# Patient Record
Sex: Female | Born: 1975 | Hispanic: Yes | Marital: Married | Smoking: Never smoker
Health system: Southern US, Community
[De-identification: ages and names within clinical notes are randomized; demographics above are authoritative.]

## PROBLEM LIST (undated history)

## (undated) DIAGNOSIS — Z789 Other specified health status: Secondary | ICD-10-CM

## (undated) DIAGNOSIS — E119 Type 2 diabetes mellitus without complications: Secondary | ICD-10-CM

## (undated) DIAGNOSIS — N611 Abscess of the breast and nipple: Secondary | ICD-10-CM

## (undated) HISTORY — PX: NO PAST SURGERIES: SHX2092

## (undated) HISTORY — DX: Abscess of the breast and nipple: N61.1

## (undated) HISTORY — DX: Type 2 diabetes mellitus without complications: E11.9

## (undated) HISTORY — DX: Other specified health status: Z78.9

---

## 2014-06-05 ENCOUNTER — Observation Stay: Payer: Self-pay | Admitting: Obstetrics and Gynecology

## 2014-06-09 ENCOUNTER — Encounter: Payer: Self-pay | Admitting: Obstetrics and Gynecology

## 2014-07-08 ENCOUNTER — Ambulatory Visit: Payer: Self-pay | Admitting: Advanced Practice Midwife

## 2014-07-15 ENCOUNTER — Ambulatory Visit: Payer: Self-pay | Admitting: Advanced Practice Midwife

## 2014-08-19 DIAGNOSIS — O24419 Gestational diabetes mellitus in pregnancy, unspecified control: Secondary | ICD-10-CM | POA: Insufficient documentation

## 2014-08-19 DIAGNOSIS — O09523 Supervision of elderly multigravida, third trimester: Secondary | ICD-10-CM | POA: Insufficient documentation

## 2014-08-19 DIAGNOSIS — O99019 Anemia complicating pregnancy, unspecified trimester: Secondary | ICD-10-CM | POA: Insufficient documentation

## 2014-08-19 DIAGNOSIS — O0990 Supervision of high risk pregnancy, unspecified, unspecified trimester: Secondary | ICD-10-CM | POA: Insufficient documentation

## 2014-12-23 NOTE — H&P (Signed)
L&D Evaluation:  History:  HPI 39 yo [id number] with LMP of  ? EDD of 09/28/14 presents tonight after bending over picking up her 39 yr old and pulled her lower back. Crying with the pain. Unable to sit up without intense pain in lower lumbar area. No spasm noted. No UC's. 4th grade education and illiteracy for pt hx., uti hx and gest DM. Pt also c/o nosebleed tonight.   Presents with back pain   Patient's Surgical History none   Medications Pre Natal Vitamins   Allergies NKDA   Social History Homemaker   Family History Non-Contributory   ROS:  ROS All systems were reviewed.  HEENT, CNS, GI, GU, Respiratory, CV, Renal and Musculoskeletal systems were found to be normal.   Exam:  Vital Signs stable   General no apparent distress   Mental Status clear   Chest clear   Heart normal sinus rhythm, no murmur/gallop/rubs   Abdomen gravid, non-tender   Estimated Fetal Weight Average for gestational age   Back No spasm of lower lumbar   Edema no edema   Mebranes Intact   FHT normal rate with no decels, FHR WNL for age of fetus   Skin dry   Lymph no lymphadenopathy   Impression:  Impression IUP at 23 4/7 weeks with lower back pain   Plan:  Plan Dc home with Norco x 1 and Flexeril and RX for Flexeril   Comments Advised to take hot showers and use heat to lower back via heating pad. If worsening, go to ER FU as assigned   Electronic Signatures: Sharee PimpleJones, Jaimy Kliethermes W (CNM)  (Signed 22-Oct-15 22:16)  Authored: L&D Evaluation   Last Updated: 22-Oct-15 22:16 by Sharee PimpleJones, Delphin Funes W (CNM)

## 2017-09-20 ENCOUNTER — Encounter (INDEPENDENT_AMBULATORY_CARE_PROVIDER_SITE_OTHER): Payer: Self-pay

## 2017-09-20 ENCOUNTER — Ambulatory Visit
Admission: RE | Admit: 2017-09-20 | Discharge: 2017-09-20 | Disposition: A | Discharge status: Home / Self Care | Payer: Self-pay | Source: Ambulatory Visit | Attending: Oncology | Admitting: Oncology

## 2017-09-20 ENCOUNTER — Ambulatory Visit: Payer: Self-pay | Attending: Oncology | Admitting: *Deleted

## 2017-09-20 VITALS — BP 107/75 | HR 81 | Temp 98.3°F | Ht 64.0 in | Wt 152.0 lb

## 2017-09-20 DIAGNOSIS — Z0000 Encounter for general adult medical examination without abnormal findings: Secondary | ICD-10-CM

## 2017-09-20 NOTE — Patient Instructions (Signed)
Gave patient hand-out, Women Staying Healthy, Active and Well from [facility], with education on breast health, pap smears, heart and colon health. 

## 2017-09-20 NOTE — Progress Notes (Signed)
Subjective:     Patient ID: Carolyn Rios, female   DOB: 03/01/1976, 42 y.o.   MRN: [id number]  HPI   Review of Systems     Objective:   Physical Exam  Pulmonary/Chest: Right breast exhibits no inverted nipple, no mass, no nipple discharge, no skin change and no tenderness. Left breast exhibits no inverted nipple, no mass, no nipple discharge, no skin change and no tenderness. Breasts are symmetrical.         Assessment:     42 year old Hispanic female referred to [facility] by Carolyn HohKarla Leath, FNP at the [facility] for further evaluation of right breast tenderness.  Carolyn Rios, the interpreter present during the interview and exam.  Patient states she had a tick bite on the left breast about a year ago.  States the tick was embedded and does not think that the tick was ever removed.  States she had an episode of left breast pain in December lasting about 2 weeks.  She thought it had something to do with the tick.  She has not experienced any pain since then.  On clinical breast exam there is no dominant mass, nipple discharge, skin changes or lymphadenopathy.  Bilateral nipples are inverted.  The patient states this is normal for her.  There is no evidence of a tick.  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  She is agreeable.  Taught self breast awareness and need for annual screening.  Last pap on 12/20.18 was negative negative.  Next pap due in 5 years.  Patient has been screened for eligibility.  She does not have any insurance, Medicare or Medicaid.  She also meets financial eligibility.  Hand-out given on the Affordable Care Act.    Plan:     Baseline screening mammogram ordered.  Will follow-up per [facility] protocol.

## 2017-09-21 ENCOUNTER — Other Ambulatory Visit: Payer: Self-pay

## 2017-09-21 DIAGNOSIS — N63 Unspecified lump in unspecified breast: Secondary | ICD-10-CM

## 2017-11-13 ENCOUNTER — Telehealth: Payer: Self-pay | Admitting: *Deleted

## 2017-11-13 NOTE — Telephone Encounter (Signed)
Called patient via [employer] # [phone] the interpreter [facility].  A gentleman answered the cell number and gave us another number to call at [phone].  I left the patient a message to return my call.  She has not responded to a certified letter sent by the breast center wanting her to return for additional imaging.

## 2017-11-14 ENCOUNTER — Telehealth: Payer: Self-pay | Admitting: *Deleted

## 2017-11-14 ENCOUNTER — Encounter: Payer: Self-pay | Admitting: *Deleted

## 2017-11-14 NOTE — Telephone Encounter (Signed)
Tried to call patient again today via the interpreter Marisue IvanLiz # [phone] from [employer].  There was no answer on the home number.  I left a message for her to return my call.  I also tried her emergency number, and there was no answer and no voicemail.

## 2018-01-09 ENCOUNTER — Encounter: Payer: Self-pay | Admitting: *Deleted

## 2018-01-09 NOTE — Progress Notes (Signed)
I have tried on several occasions to get in touch with the patient by phone to help assist with scheduling her additional views for her birads 0 mammogram.  I spoke to [facility] today in the breast center, and a certified letter that had been mailed to the patient in February was returned as unclaimed, patient does not live here.  See media for copy.  Will close case as lost to follow-up.

## 2018-02-23 ENCOUNTER — Ambulatory Visit
Admission: RE | Admit: 2018-02-23 | Discharge: 2018-02-23 | Disposition: A | Discharge status: Home / Self Care | Payer: Self-pay | Source: Ambulatory Visit | Attending: Oncology | Admitting: Oncology

## 2018-02-23 DIAGNOSIS — N63 Unspecified lump in unspecified breast: Secondary | ICD-10-CM

## 2018-03-15 HISTORY — PX: BREAST CYST ASPIRATION: SHX578

## 2018-03-20 ENCOUNTER — Other Ambulatory Visit: Payer: Self-pay | Admitting: *Deleted

## 2018-03-20 DIAGNOSIS — N6489 Other specified disorders of breast: Secondary | ICD-10-CM

## 2018-03-21 ENCOUNTER — Emergency Department
Admission: EM | Admit: 2018-03-21 | Discharge: 2018-03-21 | Disposition: A | Discharge status: Home / Self Care | Payer: Self-pay | Attending: Emergency Medicine | Admitting: Emergency Medicine

## 2018-03-21 ENCOUNTER — Other Ambulatory Visit: Payer: Self-pay

## 2018-03-21 DIAGNOSIS — N61 Mastitis without abscess: Secondary | ICD-10-CM | POA: Insufficient documentation

## 2018-03-21 LAB — POCT PREGNANCY, URINE: Preg Test, Ur: NEGATIVE

## 2018-03-21 MED ORDER — SULFAMETHOXAZOLE-TRIMETHOPRIM 800-160 MG PO TABS
1.0000 | ORAL_TABLET | Freq: Once | ORAL | Status: AC
  Administered 2018-03-21: 1 via ORAL
  Filled 2018-03-21: qty 1

## 2018-03-21 MED ORDER — SULFAMETHOXAZOLE-TRIMETHOPRIM 800-160 MG PO TABS: 1.0000 | ORAL_TABLET | Freq: Two times a day (BID) | ORAL | 0 refills | Status: DC

## 2018-03-21 MED ORDER — CEPHALEXIN 500 MG PO CAPS
500.0000 mg | ORAL_CAPSULE | Freq: Once | ORAL | Status: AC
  Administered 2018-03-21: 500 mg via ORAL
  Filled 2018-03-21: qty 1

## 2018-03-21 MED ORDER — CEPHALEXIN 500 MG PO CAPS: 500.0000 mg | ORAL_CAPSULE | Freq: Three times a day (TID) | ORAL | 0 refills | Status: DC

## 2018-03-21 NOTE — ED Triage Notes (Addendum)
Pt in with co left breast redness and pain for 6 days. States area is tender and red, no known cause. Slight redness noted to left breast right of the nipple. No obvious swelling noted or firmness.

## 2018-03-21 NOTE — ED Provider Notes (Addendum)
[facility] Emergency Department Provider Note  ____________________________________________  Time seen: Approximately 9:46 PM  I have reviewed the triage vital signs and the nursing notes.   HISTORY  Chief Complaint Breast Pain  Spanish interpreter at bedside throughout encounter  HPI Carolyn Rios is a 42 y.o. female with no significant past medical history who complains of left breast swelling and pain for the last 6 days, gradual onset.  Today it also started appearing red.  No fevers or chills.  No shortness of breath or chest pain or other symptoms.  Never had anything like this before.  Was last pregnant and gave birth in January 2016.  Last menstrual period was 2 months ago.   Symptoms are constant, gradually worsening, nonradiating pain.  Hurts to touch, no alleviating factors  Recently had a mammogram one month ago which was unconcerning.   Past medical history noncontributory   There are no active problems to display for this patient.    Past surgical history noncontributory   Prior to Admission medications   Medication Sig Start Date End Date Taking? Authorizing Provider  cephALEXin (KEFLEX) 500 MG capsule Take 1 capsule (500 mg total) by mouth 3 (three) times daily. 03/21/18   Sharman CheekStafford, Lera Gaines, MD  sulfamethoxazole-trimethoprim (BACTRIM DS) 800-160 MG tablet Take 1 tablet by mouth 2 (two) times daily. 03/21/18   Sharman CheekStafford, Skarleth Delmonico, MD     Allergies Patient has no known allergies.   No family history on file.  Social History Social History   Tobacco Use  . Smoking status: Not on file  Substance Use Topics  . Alcohol use: Not on file  . Drug use: Not on file    Review of Systems  Constitutional:   No fever or chills.   Cardiovascular:   No chest pain or syncope. Respiratory:   No dyspnea or cough. Gastrointestinal:   Negative for abdominal pain, vomiting and diarrhea.  Musculoskeletal:   Negative for focal pain or  swelling All other systems reviewed and are negative except as documented above in ROS and HPI.  ____________________________________________   PHYSICAL EXAM:  VITAL SIGNS: ED Triage Vitals  Enc Vitals Group     BP 03/21/18 2035 124/80     Pulse Rate 03/21/18 2035 85     Resp 03/21/18 2035 20     Temp 03/21/18 2035 98.5 F (36.9 C)     Temp Source 03/21/18 2035 Oral     SpO2 03/21/18 2035 99 %     Weight 03/21/18 2036 152 lb (68.9 kg)     Height --      Head Circumference --      Peak Flow --      Pain Score 03/21/18 2036 8     Pain Loc --      Pain Edu? --      Excl. in GC? --     Vital signs reviewed, nursing assessments reviewed.   Constitutional:   Alert and oriented. Non-toxic appearance. Eyes:   Conjunctivae are normal. EOMI.  ENT      Head:   Normocephalic and atraumatic.         Mouth/Throat:   MMM, no pharyngeal erythema. No peritonsillar mass.       Neck:   No meningismus. Full ROM. Hematological/Lymphatic/Immunilogical:   No cervical lymphadenopathy.  Musculoskeletal:   Normal range of motion in all extremities.  Breast exam performed with Spanish interpreter Hiram at bedside.  Left breast shows hazy erythema swelling and tenderness in  the 9 to 10 o'clock position as seen facing the patient.  Nipple is not inverted.  No purulent discharge.  No focal swelling or fluctuance. Neurologic:   Normal speech and language.  Motor grossly intact. No acute focal neurologic deficits are appreciated.  Skin:    Skin is warm, dry with breast erythema as above.  ____________________________________________    LABS (pertinent positives/negatives) (all labs ordered are listed, but only abnormal results are displayed) Labs Reviewed - No data to display ____________________________________________   EKG    ____________________________________________    RADIOLOGY  No results  found.  ____________________________________________   PROCEDURES Procedures  ____________________________________________    CLINICAL IMPRESSION / ASSESSMENT AND PLAN / ED COURSE  Pertinent labs & imaging results that were available during my care of the patient were reviewed by me and considered in my medical decision making (see chart for details).    Patient presents with nonlactational mastitis without evidence of abscess.  Vital signs are normal.  Not suspicious for cancer.  I will start her on Bactrim and Keflex.  Advised to follow-up with surgery clinic in 2 days if not improving or if she has worsening symptoms including pronounced swelling, bright redness of the skin, or fever.      ____________________________________________   FINAL CLINICAL IMPRESSION(S) / ED DIAGNOSES    Final diagnoses:  Mastitis, left, acute     ED Discharge Orders        Ordered    sulfamethoxazole-trimethoprim (BACTRIM DS) 800-160 MG tablet  2 times daily     03/21/18 2145    cephALEXin (KEFLEX) 500 MG capsule  3 times daily     03/21/18 2145      Portions of this note were generated with dragon dictation software. Dictation errors may occur despite best attempts at proofreading.    Sharman Cheek, MD 03/21/18 2151    Sharman Cheek, MD 03/21/18 2151

## 2018-04-02 ENCOUNTER — Ambulatory Visit: Payer: Self-pay | Attending: Oncology

## 2018-04-02 DIAGNOSIS — N63 Unspecified lump in unspecified breast: Secondary | ICD-10-CM

## 2018-04-02 NOTE — Progress Notes (Signed)
  Subjective:     Patient ID: Carolyn Rios, female   DOB: 04/09/1976, 42 y.o.   MRN: [id number]  HPI   Review of Systems     Objective:   Physical Exam  Pulmonary/Chest:  4x4 cm firm, mobile, purple dicoloration, left breast mass 9  o'clock with nipple inversion.           Assessment:     42 year old hispanic patient returns for [facility] evaluation regarding left breast mass.  [facility] .  She had a Birads 0 mammogram.  Had difficulty contacting patient to return for additional views.  Certified letter returned, and case closed.  Patient returned for additional views 02/23/18, with Birads 3 results, probable benign asymmetry uppe outer left breast.  Patient seen in ED on 03/21/18 with erythema left breast 9-10 o'clock .  Treated with Bactrim, and Keflex,  Appointment scheduled with Dr. Everlene FarrierPabon.  On clinical breast exam today, a 4x4cm purplish, firm moblile mass palpated at 9 o'clock left breast.  Noted left nipple inversion which patient reports as new.  No peau d'orange skin changes.      Plan:     Phoned Irving Burtonmily at [facility] and changed appointment to 04/04/18 at 10:00.  Patient given appointment information.  She is to arrive at 9:45 with photo ID and current medications.  [facility] effective date is 09/20/17.

## 2018-04-04 ENCOUNTER — Other Ambulatory Visit: Payer: Self-pay | Admitting: Surgery

## 2018-04-04 ENCOUNTER — Ambulatory Visit (INDEPENDENT_AMBULATORY_CARE_PROVIDER_SITE_OTHER): Payer: Self-pay | Admitting: Surgery

## 2018-04-04 ENCOUNTER — Other Ambulatory Visit: Payer: Self-pay | Admitting: *Deleted

## 2018-04-04 ENCOUNTER — Telehealth: Payer: Self-pay

## 2018-04-04 ENCOUNTER — Encounter: Payer: Self-pay | Admitting: Surgery

## 2018-04-04 VITALS — BP 108/71 | HR 80 | Temp 97.7°F | Wt 153.0 lb

## 2018-04-04 DIAGNOSIS — N6320 Unspecified lump in the left breast, unspecified quadrant: Secondary | ICD-10-CM

## 2018-04-04 DIAGNOSIS — Z789 Other specified health status: Secondary | ICD-10-CM

## 2018-04-04 DIAGNOSIS — N6012 Diffuse cystic mastopathy of left breast: Secondary | ICD-10-CM

## 2018-04-04 HISTORY — DX: Other specified health status: Z78.9

## 2018-04-04 MED ORDER — SULFAMETHOXAZOLE-TRIMETHOPRIM 800-160 MG PO TABS: 1.0000 | ORAL_TABLET | Freq: Two times a day (BID) | ORAL | 0 refills | Status: DC

## 2018-04-04 NOTE — Telephone Encounter (Signed)
Breast [facility] with [facility] 04/09/2018 at 7:45 AM.  Follow Up appointment with Dr. Everlene FarrierPabon: 04/25/2018 at 10:00 AM.

## 2018-04-04 NOTE — Patient Instructions (Signed)
Yo la llamare con su cita de ultrasonido y para ver al Dr. Everlene Farrierpabon en tres semanas.   Su receta fue enviado a su farmacia.  Por favor continue tomandose su antibiotico y terminelos todo.  Llamenos si tiene alguna pregunta.

## 2018-04-04 NOTE — Telephone Encounter (Signed)
Called patient back to let her know that her appointments have been scheduled. I let her know when and where she needed to go. She understood and had no further questions.

## 2018-04-04 NOTE — Progress Notes (Signed)
Surgical Consultation  04/04/2018  Carolyn Rios is an 42 y.o. female.   CC:-year-old female recently seen in the emergency room for left mastitis.  She was placed on antibiotics.  She now comes for follow-up.  She reports that she still has intermittent breast pain on the left side that is moderate intensity and sharp in nature.  Worsens when she presses and apply pressure to her breast.  No fevers or chills.  She has been on Keflex and Bactrim.  She did have ultrasound and mammogram that I have personally reviewed a month ago this was prior to the mastitis episode and he only had minor asymmetry. No definitive masses. He reports a similar episode about a year ago that resolved with antibiotics.  There is no family history of breast cancer.    Past Medical History:  Diagnosis Date  . No known health problems 04/04/2018    Past Surgical History:  Procedure Laterality Date  . NO PAST SURGERIES      Family History  Problem Relation Age of Onset  . Blindness Mother   . Diabetes Mother   . Diabetes Father   . Hypertension Sister   . Cancer Neg Hx     Social History:  reports that she has never smoked. She has never used smokeless tobacco. She reports that she does not drink alcohol or use drugs.  Allergies: No Known Allergies  Medications reviewed.   Review of Systems:   ROS Full ROS was negative other than what is stated on the HPI  Physical Exam:  BP 108/71   Pulse 80   Temp 97.7 F (36.5 C) (Oral)   Wt 153 lb (69.4 kg)   LMP  (LMP Unknown)   BMI 26.26 kg/m   Physical Exam  Constitutional: No distress.  Neck: Normal range of motion. No JVD present. No tracheal deviation present. No thyromegaly present.  Pulmonary/Chest: Effort normal. No stridor. No respiratory distress. She has no wheezes. She has no rales.  BREAST: there is an area of induration  located at 10 o'clock on her left breast. No erythema, tenderness to palpation. Normal nipple, no  discharge or cellulitis,. Axilla w/o LAD   Abdominal: Soft. She exhibits no distension and no mass. There is no tenderness. There is no rebound and no guarding.  Skin: Skin is warm. Capillary refill takes less than 2 seconds. She is not diaphoretic.  Psychiatric: She has a normal mood and affect. Her behavior is normal. Judgment and thought content normal.      Assessment/Plan: 42 year old with recurrent mastitis and questionable breast abscess.  We will obtain an ultrasound on if there is a fluid collection will recommend aspiration of the fluid collection at the same time.  We will give an additional week of Bactrim.  I will see her back in a couple weeks with hopeful resolution of her symptoms.  There is no need for surgical intervention at this time.    Spyros Winch F Gopal Malter

## 2018-04-09 ENCOUNTER — Ambulatory Visit
Admission: RE | Admit: 2018-04-09 | Discharge: 2018-04-09 | Disposition: A | Discharge status: Home / Self Care | Payer: Self-pay | Source: Ambulatory Visit | Attending: Surgery | Admitting: Surgery

## 2018-04-09 DIAGNOSIS — N6320 Unspecified lump in the left breast, unspecified quadrant: Secondary | ICD-10-CM | POA: Insufficient documentation

## 2018-04-11 ENCOUNTER — Inpatient Hospital Stay: Payer: Self-pay | Admitting: Surgery

## 2018-04-12 LAB — BODY FLUID CULTURE
Culture: NO GROWTH
SPECIAL REQUESTS: NORMAL

## 2018-04-25 ENCOUNTER — Ambulatory Visit: Payer: Self-pay

## 2018-04-25 ENCOUNTER — Encounter: Payer: Self-pay | Admitting: Surgery

## 2018-04-25 ENCOUNTER — Ambulatory Visit (INDEPENDENT_AMBULATORY_CARE_PROVIDER_SITE_OTHER): Payer: Self-pay | Admitting: Surgery

## 2018-04-25 VITALS — BP 105/71 | HR 79 | Temp 97.5°F | Wt 156.0 lb

## 2018-04-25 DIAGNOSIS — N6012 Diffuse cystic mastopathy of left breast: Secondary | ICD-10-CM

## 2018-04-25 MED ORDER — SULFAMETHOXAZOLE-TRIMETHOPRIM 800-160 MG PO TABS: 1.0000 | ORAL_TABLET | Freq: Two times a day (BID) | ORAL | 0 refills | Status: DC

## 2018-04-25 NOTE — Patient Instructions (Signed)
Por favor tome Ibuprofen 800 MG cada 8 horas para el dolor.  Aplique hielo en el area afectada cada 20 minutos cada hora.  La volvemos a ver en 2 semanas para saber como sigue.

## 2018-04-25 NOTE — Progress Notes (Signed)
Outpatient Surgical Follow Up  04/25/2018  Carolyn Rios is an 42 y.o. female.   Chief Complaint  Patient presents with  . Follow-up    Mastitis, Left     HPI: Carolyn Rios is following up after needle aspiration of left breast abscess.  Cultures have been reviewed and there is no growth.  I have personally reviewed the images from radiology revealing the breast collection that had a significant decrease after the aspiration.  She still complaining of intermittent left breast pain.  No fevers no chills.  Pain is mild to moderate intensity and sharp in nature.  Past Medical History:  Diagnosis Date  . No known health problems 04/04/2018    Past Surgical History:  Procedure Laterality Date  . NO PAST SURGERIES      Family History  Problem Relation Age of Onset  . Blindness Mother   . Diabetes Mother   . Diabetes Father   . Hypertension Sister   . Cancer Neg Hx     Social History:  reports that she has never smoked. She has never used smokeless tobacco. She reports that she does not drink alcohol or use drugs.  Allergies: No Known Allergies  Medications reviewed.    ROS Full ROS performed and is otherwise negative other than what is stated in HPI   BP 105/71   Pulse 79   Temp (!) 97.5 F (36.4 C) (Oral)   Wt 156 lb (70.8 kg)   BMI 26.78 kg/m   Physical Exam  Constitutional: She is oriented to person, place, and time. She appears well-developed and well-nourished.  Pulmonary/Chest: Effort normal. No respiratory distress.  Abdominal: Soft. She exhibits no distension. There is no tenderness. There is no guarding.  Neurological: She is alert and oriented to person, place, and time. She displays normal reflexes. No cranial nerve deficit or sensory deficit. She exhibits normal muscle tone. Coordination normal.  Skin: Skin is warm and dry.  Psychiatric: She has a normal mood and affect. Her behavior is normal. Judgment and thought content normal.  Nursing  note and vitals reviewed.  Procedure Note: Using bedside ultrasound I was able to identify a 2.7 x 1.2 cm complex fluid collection located 9:00 o'clock 1 cm from the nipple.  I discussed with the patient in detail about the procedure and after we prepped and draped the left breast in the usual sterile fashion lidocaine 1% was injected in the skin subtenons tissue.  I aspirated about half a cc of complex fluid.  Given the thickness of the fluid and the complexity of the collection I was unable to aspirate anymore.  Control his procedure well.  Assessment/Plan:  1. Mastitis chronic, left, small collection.  Do think that currently she is developing a phlegmon without any drainable fluid collection.  We will repeat another course of antibiotics and I will follow her up in 2 weeks.  No need for emergent surgical intervention at this time.     Sterling Big, MD [facility] General Surgeon

## 2018-05-09 ENCOUNTER — Ambulatory Visit (INDEPENDENT_AMBULATORY_CARE_PROVIDER_SITE_OTHER): Payer: Self-pay | Admitting: Surgery

## 2018-05-09 ENCOUNTER — Encounter: Payer: Self-pay | Admitting: Surgery

## 2018-05-09 VITALS — BP 110/70 | HR 80 | Temp 97.5°F | Wt 156.0 lb

## 2018-05-09 DIAGNOSIS — N6012 Diffuse cystic mastopathy of left breast: Secondary | ICD-10-CM

## 2018-05-09 NOTE — Progress Notes (Signed)
Outpatient Surgical Follow Up  05/09/2018  Carolyn Rios is an 42 y.o. female.   Chief Complaint  Patient presents with  . Follow-up    Mastitis, Left    HPI: Carolyn Rios is following up for chronic mastitis she had aspiration antibiotic treatment.  She feels better.  There is some mild intermittent pains in the left breast.  No fevers no chills.  No other constitutional symptoms.  No breast discharge.  Past Medical History:  Diagnosis Date  . No known health problems 04/04/2018    Past Surgical History:  Procedure Laterality Date  . NO PAST SURGERIES      Family History  Problem Relation Age of Onset  . Blindness Mother   . Diabetes Mother   . Diabetes Father   . Hypertension Sister   . Cancer Neg Hx     Social History:  reports that she has never smoked. She has never used smokeless tobacco. She reports that she does not drink alcohol or use drugs.  Allergies: No Known Allergies  Medications reviewed.    ROS Full ROS performed and is otherwise negative other than what is stated in HPI   BP 110/70   Pulse 80   Temp (!) 97.5 F (36.4 C) (Skin)   Wt 156 lb (70.8 kg)   BMI 26.78 kg/m   Physical Exam  Constitutional: She is oriented to person, place, and time. She appears well-developed and well-nourished. No distress.  Pulmonary/Chest: Effort normal. No respiratory distress.  Abdominal: Soft. She exhibits no distension and no mass. There is no tenderness. There is no guarding.  Neurological: She is alert and oriented to person, place, and time. She displays normal reflexes. No cranial nerve deficit or sensory deficit. She exhibits normal muscle tone. Coordination normal.  Skin: Skin is warm and dry. Capillary refill takes less than 2 seconds.  Psychiatric: She has a normal mood and affect. Her behavior is normal. Judgment and thought content normal.  Nursing note and vitals reviewed. BREAST: Left Breast with some mild tenderness, at 9 oclock there is  some mild induration there is significant improvement as compared to 2 weeks ago.  No evidence of necrotizing infection no evidence of cellulitis     No results found for this or any previous visit (from the past 48 hour(s)). No results found.  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  Slowly improving.  No fevers no chills no evidence of drainable collections at this time.  I would like to see her in 2 to 3 weeks and reassess for the need for open drainage versus observation.      Sterling Bigiego Lucyle Alumbaugh, MD [facility] General Surgeon

## 2018-05-09 NOTE — Patient Instructions (Signed)
Por favor llamenos si tiene alguna pregunta.  En dos semanas la vuelvo a ver. Si en dos semanas no se encuentra bien le hacemos la [place].

## 2018-05-23 ENCOUNTER — Encounter: Payer: Self-pay | Admitting: Surgery

## 2018-05-23 ENCOUNTER — Ambulatory Visit (INDEPENDENT_AMBULATORY_CARE_PROVIDER_SITE_OTHER): Payer: Self-pay | Admitting: Surgery

## 2018-05-23 VITALS — BP 122/84 | HR 84 | Temp 98.1°F | Wt 156.0 lb

## 2018-05-23 DIAGNOSIS — N6012 Diffuse cystic mastopathy of left breast: Secondary | ICD-10-CM

## 2018-05-23 NOTE — Patient Instructions (Addendum)
Por favor tomar 800 MG de Ibuprofen para el dolor. Tambien puede usar hielo en esa area para adormeser el area.  La vemos en 2 meses.  La llamamos en [employer] con sus citas.

## 2018-05-24 ENCOUNTER — Encounter: Payer: Self-pay | Admitting: Surgery

## 2018-05-24 NOTE — Progress Notes (Addendum)
Outpatient Surgical Follow Up  05/24/2018  Carolyn Rios is an 42 y.o. female.   Chief Complaint  Patient presents with  . Follow-up    Mastitis, Left    HPI: Carolyn Rios is a 42 year old following up after aspiration of an abscess and treated with antibiotic.  SHe has been having symptoms for a few weeks and she continues to have some intermittent sharp mild pain.  The pain is better but she continues to have symptoms.  No fevers no chills no evidence of active infection.  Does not smoke  Past Medical History:  Diagnosis Date  . No known health problems 04/04/2018    Past Surgical History:  Procedure Laterality Date  . NO PAST SURGERIES      Family History  Problem Relation Age of Onset  . Blindness Mother   . Diabetes Mother   . Diabetes Father   . Hypertension Sister   . Cancer Neg Hx     Social History:  reports that she has never smoked. She has never used smokeless tobacco. She reports that she does not drink alcohol or use drugs.  Allergies: No Known Allergies  Medications reviewed.    ROS Full ROS performed and is otherwise negative other than what is stated in HPI   BP 122/84   Pulse 84   Temp 98.1 F (36.7 C) (Skin)   Wt 156 lb (70.8 kg)   BMI 26.78 kg/m   Physical Exam  Constitutional: She is oriented to person, place, and time. She appears well-developed and well-nourished. No distress.  Neck: Normal range of motion. Neck supple. No JVD present. No tracheal deviation present. No thyromegaly present.  Pulmonary/Chest: Effort normal and breath sounds normal. No stridor. No respiratory distress. She has no wheezes.  Abdominal: Soft. Bowel sounds are normal. She exhibits no distension and no mass. There is no tenderness. There is no rebound and no guarding.  Musculoskeletal: Normal range of motion. She exhibits no edema.  Neurological: She is alert and oriented to person, place, and time. She displays normal reflexes. No cranial nerve deficit or  sensory deficit. She exhibits normal muscle tone. Coordination normal.  Skin: Skin is warm and dry. Capillary refill takes less than 2 seconds.  Psychiatric: She has a normal mood and affect. Her behavior is normal. Judgment and thought content normal.  Nursing note and vitals reviewed.  Assessment/Plan:  1. Mastitis chronic, left vs chronic granulomatous mastitis. There is no evidence of an abscess right now she is minimally to moderately symptomatic.  She is not septic and there is no need for any immediate surgical intervention.  I had a lengthy discussion with the patient regarding the situation and that this is difficult disease.  I will see her back with an ultrasound in a few months.  Recommend some warm compresses some NSAIDs and symptomatic management.  Patient is not toxic or septic and does not require antibiotics at this time.     Sterling Big, MD [facility] General Surgeon

## 2018-05-24 NOTE — Progress Notes (Signed)
Patient following up with Dr. Everlene Farrier for chronic mastitis.  She is scheduled for 6 month follow-up mammogram in January 2020 based on Birads 3 results from 02/23/18 mammogram. Copy to [facility].

## 2018-06-05 ENCOUNTER — Other Ambulatory Visit: Payer: Self-pay

## 2018-06-05 DIAGNOSIS — N63 Unspecified lump in unspecified breast: Secondary | ICD-10-CM

## 2018-06-15 HISTORY — PX: BREAST EXCISIONAL BIOPSY: SUR124

## 2018-06-27 ENCOUNTER — Ambulatory Visit (INDEPENDENT_AMBULATORY_CARE_PROVIDER_SITE_OTHER): Payer: Self-pay | Admitting: Surgery

## 2018-06-27 ENCOUNTER — Ambulatory Visit
Admission: RE | Admit: 2018-06-27 | Discharge: 2018-06-27 | Disposition: A | Discharge status: Home / Self Care | Payer: Self-pay | Source: Ambulatory Visit | Attending: Oncology | Admitting: Oncology

## 2018-06-27 ENCOUNTER — Encounter: Payer: Self-pay | Admitting: Surgery

## 2018-06-27 ENCOUNTER — Telehealth: Payer: Self-pay

## 2018-06-27 ENCOUNTER — Other Ambulatory Visit: Payer: Self-pay

## 2018-06-27 VITALS — BP 128/86 | HR 78 | Temp 97.7°F | Resp 12 | Ht 60.0 in | Wt 162.0 lb

## 2018-06-27 DIAGNOSIS — N611 Abscess of the breast and nipple: Secondary | ICD-10-CM

## 2018-06-27 DIAGNOSIS — N63 Unspecified lump in unspecified breast: Secondary | ICD-10-CM | POA: Insufficient documentation

## 2018-06-27 NOTE — Telephone Encounter (Signed)
Per Dr.Pabon - he would like patient added to schedule today.  Patient will call back in a few minutes as she is currently at another appointment.

## 2018-06-27 NOTE — Patient Instructions (Signed)
The patient is scheduled for [facility] with Dr Everlene FarrierPabon on 06/28/18. She will pre admit prior and arrive by 1:00 pm to the [facility]. She is aware to not eat after midnight and to only have clear liquids up until 10:30 am tomorrow. The patient is aware of date, time, and instructions.

## 2018-06-27 NOTE — Progress Notes (Signed)
Outpatient Surgical Follow Up  06/27/2018  Carolyn Rios is an 42 y.o. female.   Chief Complaint  Patient presents with  . Follow-up    HPI: 42 yo female with chronic mastitis  S/p aspiration of abscess and antibiotic therapy. Comes for f/u. Had a repeat U/S that I have personally reviewed it. Shows recurrence abscess of the left breast 5 x 2.7x 2.3 cm. She continues to have intermittent sharp pains.  No fevers no chills.  No weight loss.  Past Medical History:  Diagnosis Date  . No known health problems 04/04/2018    Past Surgical History:  Procedure Laterality Date  . NO PAST SURGERIES      Family History  Problem Relation Age of Onset  . Blindness Mother   . Diabetes Mother   . Diabetes Father   . Hypertension Sister   . Cancer Neg Hx     Social History:  reports that she has never smoked. She has never used smokeless tobacco. She reports that she does not drink alcohol or use drugs.  Allergies: No Known Allergies  Medications reviewed.    ROS Full ROS performed and is otherwise negative other than what is stated in HPI   BP 128/86   Pulse 78   Temp 97.7 F (36.5 C) (Skin)   Resp 12   Ht 5' (1.524 m)   Wt 162 lb (73.5 kg)   BMI 31.64 kg/m   Physical Exam  Constitutional: She is oriented to person, place, and time. She appears well-developed and well-nourished.  Neck: Normal range of motion. Neck supple. No JVD present. No tracheal deviation present. No thyromegaly present.  Cardiovascular: Normal rate and regular rhythm.  Pulmonary/Chest: Breath sounds normal. No stridor. No respiratory distress. She has no wheezes.  Left breast w tenderness and fluctuance. No evidence of necrotizing infection  Abdominal: Soft. She exhibits no distension. There is no tenderness.  Neurological: She is alert and oriented to person, place, and time. She displays normal reflexes. No cranial nerve deficit.  Skin: Skin is warm and dry. Capillary refill takes less  than 2 seconds.  Psychiatric: She has a normal mood and affect. Her behavior is normal. Judgment and thought content normal.  Nursing note and vitals reviewed.    No results found for this or any previous visit (from the past 48 hour(s)). Us Breast Ltd Uni Left Inc Axilla  Result Date: 06/27/2018 CLINICAL DATA:  Follow up left breast abscess. Patient has previously been treated with Bactrim and had the abscess aspirated on 04/09/2018. Cytology of the left breast aspirate revealed abundant white blood cells present with no organisms seen. The patient is following up with Dr. Pabon. EXAM: ULTRASOUND OF THE LEFT BREAST COMPARISON:  Previous exam(s). FINDINGS: On physical exam, there is erythema in the medial aspect of the left breast that is painful to palpation. Targeted ultrasound is performed, showing a hypoechoic mass in the left breast at 9 o'clock 2 cm from the nipple measuring 5.0 x 2.3 x 2.7 cm. On the prior ultrasound dated 04/09/2018 it measured 3.8 x 1.0 x 3.8 cm. Sonographic evaluation of the left axilla did not show any enlarged adenopathy. IMPRESSION: Enlarging left breast mass likely an abscess. RECOMMENDATION: Surgical treatment of the left breast abscess is recommended. Dr. Pabon was called and made aware of the sonographic findings. Dr. Pabon says he will do an open surgical drainage and biopsy the mass. Patient is due for her bilateral screening mammogram in February of 2020. I have   discussed the findings and recommendations with the patient. Results were also provided in writing at the conclusion of the visit. If applicable, a reminder letter will be sent to the patient regarding the next appointment. BI-RADS CATEGORY  2: Benign. Electronically Signed   By: Dina  Arceo M.D.   On: 06/27/2018 10:58    Assessment/Plan: ReCurrent left breast abscess in a patient with chronic mastitis.  Given that she has failed multiple aspirations I do think that the next step in treatment will be  incision and drainage formally in the operating room.  At that time I will also obtain a biopsy of the abscess cavity to make sure there is no evidence of inflammatory breast cancer.  I had a lengthy discussion with the patient about her disease process.  The procedure.  Risk benefit and possible implications including but not limited to: Bleeding, infection, recurrence and chronic pain.  She understands and wishes to proceed  Greater than 50% of the 25 minutes  visit was spent in counseling/coordination of care   Diego Pabon, MD FACS General Surgeon 

## 2018-06-27 NOTE — H&P (View-Only) (Signed)
Outpatient Surgical Follow Up  06/27/2018  Carolyn Rios is an 42 y.o. female.   Chief Complaint  Patient presents with  . Follow-up    HPI: 42 yo female with chronic mastitis  S/p aspiration of abscess and antibiotic therapy. Comes for f/u. Had a repeat U/S that I have personally reviewed it. Shows recurrence abscess of the left breast 5 x 2.7x 2.3 cm. She continues to have intermittent sharp pains.  No fevers no chills.  No weight loss.  Past Medical History:  Diagnosis Date  . No known health problems 04/04/2018    Past Surgical History:  Procedure Laterality Date  . NO PAST SURGERIES      Family History  Problem Relation Age of Onset  . Blindness Mother   . Diabetes Mother   . Diabetes Father   . Hypertension Sister   . Cancer Neg Hx     Social History:  reports that she has never smoked. She has never used smokeless tobacco. She reports that she does not drink alcohol or use drugs.  Allergies: No Known Allergies  Medications reviewed.    ROS Full ROS performed and is otherwise negative other than what is stated in HPI   BP 128/86   Pulse 78   Temp 97.7 F (36.5 C) (Skin)   Resp 12   Ht 5' (1.524 m)   Wt 162 lb (73.5 kg)   BMI 31.64 kg/m   Physical Exam  Constitutional: She is oriented to person, place, and time. She appears well-developed and well-nourished.  Neck: Normal range of motion. Neck supple. No JVD present. No tracheal deviation present. No thyromegaly present.  Cardiovascular: Normal rate and regular rhythm.  Pulmonary/Chest: Breath sounds normal. No stridor. No respiratory distress. She has no wheezes.  Left breast w tenderness and fluctuance. No evidence of necrotizing infection  Abdominal: Soft. She exhibits no distension. There is no tenderness.  Neurological: She is alert and oriented to person, place, and time. She displays normal reflexes. No cranial nerve deficit.  Skin: Skin is warm and dry. Capillary refill takes less  than 2 seconds.  Psychiatric: She has a normal mood and affect. Her behavior is normal. Judgment and thought content normal.  Nursing note and vitals reviewed.    No results found for this or any previous visit (from the past 48 hour(s)). [place] Breast Ltd Uni Left Inc Axilla  Result Date: 06/27/2018 CLINICAL DATA:  Follow up left breast abscess. Patient has previously been treated with Bactrim and had the abscess aspirated on 04/09/2018. Cytology of the left breast aspirate revealed abundant white blood cells present with no organisms seen. The patient is following up with Dr. Everlene FarrierPabon. EXAM: ULTRASOUND OF THE LEFT BREAST COMPARISON:  Previous exam(s). FINDINGS: On physical exam, there is erythema in the medial aspect of the left breast that is painful to palpation. Targeted ultrasound is performed, showing a hypoechoic mass in the left breast at 9 o'clock 2 cm from the nipple measuring 5.0 x 2.3 x 2.7 cm. On the prior ultrasound dated 04/09/2018 it measured 3.8 x 1.0 x 3.8 cm. Sonographic evaluation of the left axilla did not show any enlarged adenopathy. IMPRESSION: Enlarging left breast mass likely an abscess. RECOMMENDATION: Surgical treatment of the left breast abscess is recommended. Dr. Everlene FarrierPabon was called and made aware of the sonographic findings. Dr. Everlene FarrierPabon says he will do an open surgical drainage and biopsy the mass. Patient is due for her bilateral screening mammogram in February of 2020. I have  discussed the findings and recommendations with the patient. Results were also provided in writing at the conclusion of the visit. If applicable, a reminder letter will be sent to the patient regarding the next appointment. BI-RADS CATEGORY  2: Benign. Electronically Signed   By: Baird Lyons M.D.   On: 06/27/2018 10:58    Assessment/Plan: ReCurrent left breast abscess in a patient with chronic mastitis.  Given that she has failed multiple aspirations I do think that the next step in treatment will be  incision and drainage formally in the operating room.  At that time I will also obtain a biopsy of the abscess cavity to make sure there is no evidence of inflammatory breast cancer.  I had a lengthy discussion with the patient about her disease process.  The procedure.  Risk benefit and possible implications including but not limited to: Bleeding, infection, recurrence and chronic pain.  She understands and wishes to proceed  Greater than 50% of the 25 minutes  visit was spent in counseling/coordination of care   Sterling Big, MD [facility] General Surgeon

## 2018-06-28 ENCOUNTER — Encounter
Admission: RE | Disposition: A | Discharge status: Home / Self Care | Payer: Self-pay | Source: Ambulatory Visit | Attending: Surgery

## 2018-06-28 ENCOUNTER — Ambulatory Visit: Payer: Self-pay | Admitting: Certified Registered"

## 2018-06-28 ENCOUNTER — Ambulatory Visit
Admission: RE | Admit: 2018-06-28 | Discharge: 2018-06-28 | Disposition: A | Discharge status: Home / Self Care | Payer: Self-pay | Source: Ambulatory Visit | Attending: Surgery | Admitting: Surgery

## 2018-06-28 DIAGNOSIS — N6012 Diffuse cystic mastopathy of left breast: Secondary | ICD-10-CM | POA: Insufficient documentation

## 2018-06-28 DIAGNOSIS — N6320 Unspecified lump in the left breast, unspecified quadrant: Secondary | ICD-10-CM | POA: Insufficient documentation

## 2018-06-28 DIAGNOSIS — N611 Abscess of the breast and nipple: Secondary | ICD-10-CM | POA: Insufficient documentation

## 2018-06-28 HISTORY — PX: INCISION AND DRAINAGE ABSCESS: SHX5864

## 2018-06-28 LAB — POCT PREGNANCY, URINE: Preg Test, Ur: NEGATIVE

## 2018-06-28 SURGERY — INCISION AND DRAINAGE, ABSCESS
Anesthesia: General | Laterality: Left

## 2018-06-28 MED ORDER — AMOXICILLIN-POT CLAVULANATE 875-125 MG PO TABS: 1.0000 | ORAL_TABLET | Freq: Two times a day (BID) | ORAL | 0 refills | Status: AC

## 2018-06-28 MED ORDER — OXYCODONE HCL 5 MG/5ML PO SOLN: 5.0000 mg | Freq: Once | ORAL | Status: DC | PRN

## 2018-06-28 MED ORDER — LACTATED RINGERS IV SOLN: INTRAVENOUS | Status: DC

## 2018-06-28 MED ORDER — MIDAZOLAM HCL 2 MG/2ML IJ SOLN
INTRAMUSCULAR | Status: DC | PRN
  Administered 2018-06-28: 2 mg via INTRAVENOUS

## 2018-06-28 MED ORDER — FENTANYL CITRATE (PF) 100 MCG/2ML IJ SOLN
INTRAMUSCULAR | Status: AC
  Administered 2018-06-28: 25 ug via INTRAVENOUS
  Filled 2018-06-28: qty 2

## 2018-06-28 MED ORDER — HYDROCODONE-ACETAMINOPHEN 5-325 MG PO TABS
ORAL_TABLET | ORAL | Status: AC
  Administered 2018-06-28: 1 via ORAL
  Filled 2018-06-28: qty 1

## 2018-06-28 MED ORDER — PROPOFOL 10 MG/ML IV BOLUS
INTRAVENOUS | Status: AC
  Filled 2018-06-28: qty 20

## 2018-06-28 MED ORDER — ACETAMINOPHEN 10 MG/ML IV SOLN
INTRAVENOUS | Status: DC | PRN
  Administered 2018-06-28: 1000 mg via INTRAVENOUS

## 2018-06-28 MED ORDER — MEPERIDINE HCL 50 MG/ML IJ SOLN: 6.2500 mg | INTRAMUSCULAR | Status: DC | PRN

## 2018-06-28 MED ORDER — HYDROCODONE-ACETAMINOPHEN 5-325 MG PO TABS
1.0000 | ORAL_TABLET | Freq: Once | ORAL | Status: AC
  Administered 2018-06-28: 1 via ORAL

## 2018-06-28 MED ORDER — DEXAMETHASONE SODIUM PHOSPHATE 10 MG/ML IJ SOLN
INTRAMUSCULAR | Status: DC | PRN
  Administered 2018-06-28: 10 mg via INTRAVENOUS

## 2018-06-28 MED ORDER — CHLORHEXIDINE GLUCONATE CLOTH 2 % EX PADS: 6.0000 | MEDICATED_PAD | Freq: Once | CUTANEOUS | Status: DC

## 2018-06-28 MED ORDER — HYDROCODONE-ACETAMINOPHEN 5-325 MG PO TABS: 1.0000 | ORAL_TABLET | Freq: Four times a day (QID) | ORAL | 0 refills | Status: DC | PRN

## 2018-06-28 MED ORDER — FAMOTIDINE 20 MG PO TABS
20.0000 mg | ORAL_TABLET | Freq: Once | ORAL | Status: AC
  Administered 2018-06-28: 20 mg via ORAL

## 2018-06-28 MED ORDER — FENTANYL CITRATE (PF) 100 MCG/2ML IJ SOLN
INTRAMUSCULAR | Status: DC | PRN
  Administered 2018-06-28: 25 ug via INTRAVENOUS
  Administered 2018-06-28: 50 ug via INTRAVENOUS
  Administered 2018-06-28: 25 ug via INTRAVENOUS

## 2018-06-28 MED ORDER — CEFAZOLIN SODIUM-DEXTROSE 2-4 GM/100ML-% IV SOLN
INTRAVENOUS | Status: AC
  Filled 2018-06-28: qty 100

## 2018-06-28 MED ORDER — ACETAMINOPHEN 10 MG/ML IV SOLN
INTRAVENOUS | Status: AC
  Filled 2018-06-28: qty 100

## 2018-06-28 MED ORDER — OXYCODONE HCL 5 MG PO TABS: 5.0000 mg | ORAL_TABLET | Freq: Once | ORAL | Status: DC | PRN

## 2018-06-28 MED ORDER — FENTANYL CITRATE (PF) 100 MCG/2ML IJ SOLN
INTRAMUSCULAR | Status: AC
  Filled 2018-06-28: qty 2

## 2018-06-28 MED ORDER — MIDAZOLAM HCL 2 MG/2ML IJ SOLN
INTRAMUSCULAR | Status: AC
  Filled 2018-06-28: qty 2

## 2018-06-28 MED ORDER — FENTANYL CITRATE (PF) 100 MCG/2ML IJ SOLN
25.0000 ug | INTRAMUSCULAR | Status: DC | PRN
  Administered 2018-06-28 (×4): 25 ug via INTRAVENOUS

## 2018-06-28 MED ORDER — LIDOCAINE HCL (CARDIAC) PF 100 MG/5ML IV SOSY
PREFILLED_SYRINGE | INTRAVENOUS | Status: DC | PRN
  Administered 2018-06-28: 100 mg via INTRAVENOUS

## 2018-06-28 MED ORDER — PROPOFOL 10 MG/ML IV BOLUS
INTRAVENOUS | Status: DC | PRN
  Administered 2018-06-28: 50 mg via INTRAVENOUS
  Administered 2018-06-28: 150 mg via INTRAVENOUS

## 2018-06-28 MED ORDER — FAMOTIDINE 20 MG PO TABS
ORAL_TABLET | ORAL | Status: AC
  Administered 2018-06-28: 20 mg via ORAL
  Filled 2018-06-28: qty 1

## 2018-06-28 MED ORDER — ONDANSETRON HCL 4 MG/2ML IJ SOLN
INTRAMUSCULAR | Status: DC | PRN
  Administered 2018-06-28: 4 mg via INTRAVENOUS

## 2018-06-28 MED ORDER — PROMETHAZINE HCL 25 MG/ML IJ SOLN: 6.2500 mg | INTRAMUSCULAR | Status: DC | PRN

## 2018-06-28 MED ORDER — CEFAZOLIN SODIUM-DEXTROSE 2-4 GM/100ML-% IV SOLN
2.0000 g | INTRAVENOUS | Status: AC
  Administered 2018-06-28: 2 g via INTRAVENOUS

## 2018-06-28 MED ORDER — KETOROLAC TROMETHAMINE 30 MG/ML IJ SOLN
INTRAMUSCULAR | Status: DC | PRN
  Administered 2018-06-28: 30 mg via INTRAVENOUS

## 2018-06-28 SURGICAL SUPPLY — 20 items
BLADE CLIPPER SURG (BLADE) ×3 IMPLANT
BLADE SURG 15 STRL LF DISP TIS (BLADE) ×1 IMPLANT
BLADE SURG 15 STRL SS (BLADE) ×2
BRUSH SCRUB EZ  4% CHG (MISCELLANEOUS) ×2
BRUSH SCRUB EZ 4% CHG (MISCELLANEOUS) ×1 IMPLANT
CANISTER SUCT 3000ML PPV (MISCELLANEOUS) ×3 IMPLANT
COVER WAND RF STERILE (DRAPES) ×3 IMPLANT
DRAPE LAPAROTOMY 77X122 PED (DRAPES) ×3 IMPLANT
ELECT REM PT RETURN 9FT ADLT (ELECTROSURGICAL) ×3
ELECTRODE REM PT RTRN 9FT ADLT (ELECTROSURGICAL) ×1 IMPLANT
GAUZE PACKING IODOFORM 1/2 (PACKING) ×3 IMPLANT
GLOVE BIO SURGEON STRL SZ7 (GLOVE) ×3 IMPLANT
GOWN STRL REUS W/ TWL LRG LVL3 (GOWN DISPOSABLE) ×2 IMPLANT
GOWN STRL REUS W/TWL LRG LVL3 (GOWN DISPOSABLE) ×4
NEEDLE HYPO 22GX1.5 SAFETY (NEEDLE) ×3 IMPLANT
NS IRRIG 1000ML POUR BTL (IV SOLUTION) ×3 IMPLANT
PACK BASIN MINOR ARMC (MISCELLANEOUS) ×3 IMPLANT
SOL PREP PVP 2OZ (MISCELLANEOUS) ×3
SOLUTION PREP PVP 2OZ (MISCELLANEOUS) ×1 IMPLANT
SPONGE LAP 18X18 RF (DISPOSABLE) ×3 IMPLANT

## 2018-06-28 NOTE — Addendum Note (Signed)
Addended by: Sterling BigPABON, Alika Eppes F on: 06/28/2018 08:51 AM   Modules accepted: Orders, SmartSet

## 2018-06-28 NOTE — Transfer of Care (Signed)
Immediate Anesthesia Transfer of Care Note  Patient: Carolyn Rios  Procedure(s) Performed: INCISION AND DRAINAGE LEFT BREAST ABSCESS (Left )  Patient Location: PACU  Anesthesia Type:General  Level of Consciousness: awake, alert  and oriented  Airway & Oxygen Therapy: Patient Spontanous Breathing and Patient connected to face mask oxygen  Post-op Assessment: Report given to RN and Post -op Vital signs reviewed and stable  Post vital signs: Reviewed and stable  Last Vitals:  Vitals Value Taken Time  BP 141/98 06/28/2018  2:50 PM  Temp    Pulse 91 06/28/2018  2:50 PM  Resp 17 06/28/2018  2:50 PM  SpO2 98 % 06/28/2018  2:50 PM  Vitals shown include unvalidated device data.  Last Pain:  Vitals:   06/28/18 1343  PainSc: 0-No pain         Complications: No apparent anesthesia complications

## 2018-06-28 NOTE — Interval H&P Note (Signed)
History and Physical Interval Note:  06/28/2018 1:50 PM  Carolyn Rios  has presented today for surgery, with the diagnosis of breast abscess  The various methods of treatment have been discussed with the patient and family. After consideration of risks, benefits and other options for treatment, the patient has consented to  Procedure(s): INCISION AND DRAINAGE LEFT BREAST ABSCESS (Left) as a surgical intervention .  The patient's history has been reviewed, patient examined, no change in status, stable for surgery.  I have reviewed the patient's chart and labs.  Questions were answered to the patient's satisfaction.     Diego F Pabon

## 2018-06-28 NOTE — Anesthesia Post-op Follow-up Note (Signed)
Anesthesia QCDR form completed.        

## 2018-06-28 NOTE — Op Note (Signed)
  06/28/2018  2:36 PM  PATIENT:  Carolyn Rios  42 y.o. female  PRE-OPERATIVE DIAGNOSIS:  Left Breast complex abscess  POST-OPERATIVE DIAGNOSIS:  Same  PROCEDURE:   1. Incision and drainage of complex left breast abscess 2. Excisional debridement of skin subcutaneous tissue and 9 square centimeters 3. Incisional biopsy of left breast    SURGEON:  Surgeon(s) and Role:    * ,  F, MD - Primary   ANESTHESIA: GETA  INDICATIONS FOR PROCEDUREBreast abscess  DICTATION:  Patient was explained  about procedure indetail, risks, benefits and possible complications and a consent was obtained. The patient taken to the operating room and placed in the supine position.   Examination revealed abscess retro areolar area 12 o'clock. 15 blade used to create areolar incision. Cautery was used to divide sub q tissue. The cavity was deep. Using 15 blade knife abscess was drained and cultures obtained. 15cc pus was drained. Loculation were lysed with finger fracturing. We use curette to perform debridement of sub q tissue. Hemostasis obtained w cautery. A incisional biopsy of the cavity was obtained with a 15 blade knife. We also took a small sample of the skin of the areola to r/o inflammatory breast CA due to the chronicity of the infections. 1/2 inch packing placed and sterile dressing placed.    Needle and laparotomy counts were correct and there were no immediate complications  Leafy Roiego F , MD

## 2018-06-28 NOTE — Anesthesia Procedure Notes (Signed)
Procedure Name: LMA Insertion Date/Time: 06/28/2018 2:14 PM Performed by: Danelle BerryWarr, Akashdeep Chuba E, CRNA Pre-anesthesia Checklist: Patient identified, Emergency Drugs available, Suction available, Patient being monitored and Timeout performed Patient Re-evaluated:Patient Re-evaluated prior to induction Oxygen Delivery Method: Circle system utilized and Simple face mask Preoxygenation: Pre-oxygenation with 100% oxygen Induction Type: IV induction LMA: LMA inserted LMA Size: 4.0 Dental Injury: Teeth and Oropharynx as per pre-operative assessment

## 2018-06-28 NOTE — Discharge Instructions (Signed)

## 2018-06-28 NOTE — Anesthesia Preprocedure Evaluation (Signed)
Anesthesia Evaluation  Patient identified by MRN, date of birth, ID band Patient awake    Reviewed: Allergy & Precautions, NPO status , Patient's Chart, lab work & pertinent test results  History of Anesthesia Complications Negative for: history of anesthetic complications  Airway Mallampati: II  TM Distance: >3 FB Neck ROM: Full    Dental no notable dental hx.    Pulmonary neg pulmonary ROS, neg sleep apnea, neg COPD,    breath sounds clear to auscultation- rhonchi (-) wheezing      Cardiovascular Exercise Tolerance: Good (-) hypertension(-) CAD, (-) Past MI, (-) Cardiac Stents and (-) CABG  Rhythm:Regular Rate:Normal - Systolic murmurs and - Diastolic murmurs    Neuro/Psych negative neurological ROS  negative psych ROS   GI/Hepatic negative GI ROS, Neg liver ROS,   Endo/Other  negative endocrine ROSneg diabetes  Renal/GU negative Renal ROS     Musculoskeletal negative musculoskeletal ROS (+)   Abdominal (+) + obese,   Peds  Hematology  (+) anemia ,   Anesthesia Other Findings    Reproductive/Obstetrics                             Anesthesia Physical Anesthesia Plan  ASA: II  Anesthesia Plan: General   Post-op Pain Management:    Induction: Intravenous  PONV Risk Score and Plan: 2 and Ondansetron, Dexamethasone and Midazolam  Airway Management Planned: LMA  Additional Equipment:   Intra-op Plan:   Post-operative Plan:   Informed Consent: I have reviewed the patients History and Physical, chart, labs and discussed the procedure including the risks, benefits and alternatives for the proposed anesthesia with the patient or authorized representative who has indicated his/her understanding and acceptance.   Dental advisory given  Plan Discussed with: CRNA and Anesthesiologist  Anesthesia Plan Comments:         Anesthesia Quick Evaluation

## 2018-06-29 ENCOUNTER — Encounter: Payer: Self-pay | Admitting: Surgery

## 2018-07-02 LAB — SURGICAL PATHOLOGY

## 2018-07-02 NOTE — Anesthesia Postprocedure Evaluation (Signed)
Anesthesia Post Note  Patient: Carolyn Rios  Procedure(s) Performed: INCISION AND DRAINAGE LEFT BREAST ABSCESS (Left )  Patient location during evaluation: PACU Anesthesia Type: General Level of consciousness: awake and alert and oriented Pain management: pain level controlled Vital Signs Assessment: post-procedure vital signs reviewed and stable Respiratory status: spontaneous breathing, nonlabored ventilation and respiratory function stable Cardiovascular status: blood pressure returned to baseline and stable Postop Assessment: no signs of nausea or vomiting Anesthetic complications: no     Last Vitals:  Vitals:   06/28/18 1540 06/28/18 1550  BP:  128/62  Pulse: 76 74  Resp: 16 18  Temp: 36.7 C 36.8 C  SpO2: 94% 95%    Last Pain:  Vitals:   06/28/18 1633  TempSrc:   PainSc: 3                  Malik Ruffino

## 2018-07-03 LAB — AEROBIC/ANAEROBIC CULTURE W GRAM STAIN (SURGICAL/DEEP WOUND): Culture: NO GROWTH

## 2018-07-03 LAB — AEROBIC/ANAEROBIC CULTURE (SURGICAL/DEEP WOUND)

## 2018-07-04 ENCOUNTER — Ambulatory Visit: Payer: Self-pay | Admitting: Surgery

## 2018-07-11 ENCOUNTER — Encounter: Payer: Self-pay | Admitting: Surgery

## 2018-07-11 ENCOUNTER — Ambulatory Visit: Payer: Self-pay | Admitting: Surgery

## 2018-07-11 ENCOUNTER — Other Ambulatory Visit: Payer: Self-pay

## 2018-07-11 ENCOUNTER — Ambulatory Visit (INDEPENDENT_AMBULATORY_CARE_PROVIDER_SITE_OTHER): Payer: Self-pay | Admitting: Surgery

## 2018-07-11 VITALS — BP 113/82 | HR 80 | Temp 97.7°F | Resp 20 | Wt 160.8 lb

## 2018-07-11 DIAGNOSIS — N611 Abscess of the breast and nipple: Secondary | ICD-10-CM

## 2018-07-11 DIAGNOSIS — Z09 Encounter for follow-up examination after completed treatment for conditions other than malignant neoplasm: Secondary | ICD-10-CM

## 2018-07-11 NOTE — Patient Instructions (Signed)
Patient is to return to the office as needed.  Call the office with any questions or concerns. 

## 2018-07-16 ENCOUNTER — Encounter: Payer: Self-pay | Admitting: Surgery

## 2018-07-16 NOTE — Progress Notes (Signed)
S/p I/d left breast abscess and bx Biopsy d/w the pt in detail Significant improvement  PE NAD Breast, wound healing well, good granulation. No infection or un drained pockets  A/p Doping well continue wound care F/U prn

## 2018-07-30 ENCOUNTER — Ambulatory Visit (INDEPENDENT_AMBULATORY_CARE_PROVIDER_SITE_OTHER): Payer: Self-pay | Admitting: Surgery

## 2018-07-30 ENCOUNTER — Encounter: Payer: Self-pay | Admitting: Surgery

## 2018-07-30 ENCOUNTER — Other Ambulatory Visit: Payer: Self-pay

## 2018-07-30 VITALS — BP 118/83 | HR 82 | Temp 97.5°F | Resp 16 | Ht 59.0 in | Wt 161.8 lb

## 2018-07-30 DIAGNOSIS — N611 Abscess of the breast and nipple: Secondary | ICD-10-CM

## 2018-07-30 MED ORDER — HYDROCODONE-ACETAMINOPHEN 5-325 MG PO TABS: 1.0000 | ORAL_TABLET | Freq: Four times a day (QID) | ORAL | 0 refills | Status: DC | PRN

## 2018-07-30 MED ORDER — AMOXICILLIN-POT CLAVULANATE 875-125 MG PO TABS: 1.0000 | ORAL_TABLET | Freq: Two times a day (BID) | ORAL | 0 refills | Status: AC

## 2018-07-30 NOTE — Progress Notes (Signed)
Outpatient Surgical Follow Up  07/30/2018  Carolyn Rios is an 42 y.o. female.   Chief Complaint  Patient presents with  . Routine Post Op    Left breast I&D     HPI: Carolyn Rios had an I&D of her left breast with some chronic mastitis that was about a month ago not she comes with some erythema and new pain on the left breast but is away from the original cavity that was drained.  No fevers no chills.  Past Medical History:  Diagnosis Date  . No known health problems 04/04/2018    Past Surgical History:  Procedure Laterality Date  . INCISION AND DRAINAGE ABSCESS Left 06/28/2018   Procedure: INCISION AND DRAINAGE LEFT BREAST ABSCESS;  Surgeon: Leafy Ro, MD;  Location: [facility] ORS;  Service: General;  Laterality: Left;  . NO PAST SURGERIES      Family History  Problem Relation Age of Onset  . Blindness Mother   . Diabetes Mother   . Diabetes Father   . Hypertension Sister   . Cancer Neg Hx     Social History:  reports that she has never smoked. She has never used smokeless tobacco. She reports that she does not drink alcohol or use drugs.  Allergies: No Known Allergies  Medications reviewed.    ROS Full ROS performed and is otherwise negative other than what is stated in HPI   BP 118/83   Pulse 82   Temp (!) 97.5 F (36.4 C) (Temporal)   Resp 16   Ht 4\' 11"  (1.499 m)   Wt 161 lb 12.8 oz (73.4 kg)   SpO2 95%   BMI 32.68 kg/m   Physical Exam  NAD Breast there are two areas at 9 o'clock on her left breast. One is in the sternal area with induration and erythema and there was an other opening that was at 9:00 3 cm from the medial wound with some serous drainage. Reviews I&D of the cavity a month ago is healing well without evidence of infection or recurrence. No evidence of necrotizing infection   No results found for this or any previous visit (from the past 48 hour(s)). No results found.  Assessment/Plan:  1. Left breast abscess NEw abscess  about 4 cms from previous one. D/W the pt in detail and I do recommend I/D. I offered her to do it in the OR but she wants me to do it in the office. Procedure d/w the pt in detail. Risks, benefits and possible complications d/w the pt in detail. She understands. We will place her on A/B therapy as well  Procedure note 1. I/D complex Right breast abscess  Anesthesia: lidocaine 1% w Epi 19cc  Complications: none  Informed consent was obtained patient was placed in the supine position and was prepped and draped in the usual sterile fashion.  Local anesthetic was infiltrated in standard fashion.  There was 2 areas on the left breast one medially.  located 9:00 and close to the sternum and the other one same 9:00 but about 3 cm from it.  Both areas were were infiltrated.  The lateral area was drained after an elliptical incision was created and about 5cc pus was drained.  Appropriate cultures were sent out.  Using a sharp curette were able to debride the cavity.  Attention then was turned to the additional opening that was about 3 cm from the main abscess.  I was able to also debride that area and it was only  a 1 x 1 cm opening that I debrided it.  There was no communications changes 2 distinct cavities and there was no evidence of any deep tracking.  Quarter inch iodoform packing was placed.  No immediate complications   Sterling Bigiego Travor Royce, MD [facility] General Surgeon

## 2018-07-30 NOTE — Patient Instructions (Signed)
We have seen you today for an Abscess. Please remove old packing , shower as usual and replace new packing in wound and cover with dressing.   Please see information included below.  Please follow-up in 2 weeks Your appointment information is below.  Continue your antibiotics until complete. If you have any questions or concerns prior to your next appointment, please call our office and speak with a nurse.

## 2018-08-03 LAB — ANAEROBIC AND AEROBIC CULTURE

## 2018-08-14 ENCOUNTER — Encounter: Payer: Self-pay | Admitting: Surgery

## 2018-09-11 ENCOUNTER — Encounter: Payer: Self-pay | Admitting: Surgery

## 2018-09-19 ENCOUNTER — Encounter: Payer: Self-pay | Admitting: Surgery

## 2018-09-24 ENCOUNTER — Ambulatory Visit: Payer: Self-pay | Attending: Oncology

## 2018-09-24 ENCOUNTER — Other Ambulatory Visit: Payer: Self-pay

## 2018-10-03 ENCOUNTER — Ambulatory Visit (INDEPENDENT_AMBULATORY_CARE_PROVIDER_SITE_OTHER): Payer: Self-pay | Admitting: Surgery

## 2018-10-03 ENCOUNTER — Encounter: Payer: Self-pay | Admitting: *Deleted

## 2018-10-03 ENCOUNTER — Encounter: Payer: Self-pay | Admitting: Surgery

## 2018-10-03 ENCOUNTER — Other Ambulatory Visit: Payer: Self-pay

## 2018-10-03 VITALS — BP 119/83 | HR 97 | Temp 97.7°F | Resp 14 | Ht 59.0 in | Wt 161.2 lb

## 2018-10-03 DIAGNOSIS — N61 Mastitis without abscess: Secondary | ICD-10-CM

## 2018-10-03 MED ORDER — AMOXICILLIN-POT CLAVULANATE 875-125 MG PO TABS: 1.0000 | ORAL_TABLET | Freq: Two times a day (BID) | ORAL | 0 refills | Status: AC

## 2018-10-03 NOTE — Progress Notes (Signed)
Patient to be scheduled for a right breast ultrasound with aspiration.   A message has been sent to Sam in mammography at the [facility] to get this arranged.   A follow up appointment has been scheduled for the patient to see Dr. Everlene Farrier on 10-10-18 at 11:45 am.  If ultrasound/aspiration is not completed prior, patient is aware this appointment will need to be rescheduled.

## 2018-10-03 NOTE — Patient Instructions (Signed)
We will order the Ultrasound with aspiration .   Please pick up your medication at the pharmacy.

## 2018-10-04 ENCOUNTER — Other Ambulatory Visit: Payer: Self-pay | Admitting: *Deleted

## 2018-10-04 DIAGNOSIS — N61 Mastitis without abscess: Secondary | ICD-10-CM

## 2018-10-04 NOTE — Progress Notes (Signed)
[id number] 

## 2018-10-05 ENCOUNTER — Encounter: Payer: Self-pay | Admitting: Surgery

## 2018-10-05 NOTE — Progress Notes (Signed)
Outpatient Surgical Follow Up  10/05/2018  Carolyn Rios is an 43 y.o. female.   Chief Complaint  Patient presents with  . Follow-up    Right breast pain    HPI: Carolyn Rios is well known to me for hx Left mastitis and abscess. Now she comes for breast infecion on there contralateral side. Pain that is moderate, intermittent. No fevers or chills  Past Medical History:  Diagnosis Date  . No known health problems 04/04/2018    Past Surgical History:  Procedure Laterality Date  . INCISION AND DRAINAGE ABSCESS Left 06/28/2018   Procedure: INCISION AND DRAINAGE LEFT BREAST ABSCESS;  Surgeon: Leafy Ro, MD;  Location: [facility] ORS;  Service: General;  Laterality: Left;  . NO PAST SURGERIES      Family History  Problem Relation Age of Onset  . Blindness Mother   . Diabetes Mother   . Diabetes Father   . Hypertension Sister   . Cancer Neg Hx     Social History:  reports that she has never smoked. She has never used smokeless tobacco. She reports that she does not drink alcohol or use drugs.  Allergies: No Known Allergies  Medications reviewed.    ROS Full ROS performed and is otherwise negative other than what is stated in HPI   BP 119/83   Pulse 97   Temp 97.7 F (36.5 C) (Temporal)   Resp 14   Ht 4\' 11"  (1.499 m)   Wt 161 lb 3.2 oz (73.1 kg)   SpO2 92%   BMI 32.56 kg/m   Physical Exam NAD, alert Breast: Right breast mild fullness , not obvious erythema but TTP. Some very mild induration. Exam is limited due to pain. No obvious masses, skin and nipple is intact. Left breast scar has healed Abd: soft, nt no masses Neuro: gcs 15, no motor or sens defictis   Assessment/Plan: Acute MAstitis on the right . 1. Mastitis in female Start A/B rx w Augmentin 875 BID. I will order U/S and possible aspiration in case there is a drainable collection.no evidence of obvious abscess or necrotizing infection that would mandate surgical intervention - US BREAST  ASPIRATION RIGHT; Future - US BREAST LTD UNI RIGHT INC AXILLA; Future    Greater than 50% of the 15 minutes  visit was spent in counseling/coordination of care   Sterling Big, MD [facility] General Surgeon

## 2018-10-08 ENCOUNTER — Ambulatory Visit
Admission: RE | Admit: 2018-10-08 | Discharge: 2018-10-08 | Disposition: A | Discharge status: Home / Self Care | Payer: Self-pay | Source: Ambulatory Visit | Attending: Surgery | Admitting: Surgery

## 2018-10-08 ENCOUNTER — Other Ambulatory Visit: Payer: Self-pay | Admitting: *Deleted

## 2018-10-08 ENCOUNTER — Other Ambulatory Visit: Payer: Self-pay | Admitting: Surgery

## 2018-10-08 DIAGNOSIS — N61 Mastitis without abscess: Secondary | ICD-10-CM

## 2018-10-08 DIAGNOSIS — N63 Unspecified lump in unspecified breast: Secondary | ICD-10-CM

## 2018-10-10 ENCOUNTER — Ambulatory Visit: Payer: Self-pay | Admitting: Surgery

## 2018-10-13 LAB — AEROBIC/ANAEROBIC CULTURE W GRAM STAIN (SURGICAL/DEEP WOUND)

## 2018-10-19 ENCOUNTER — Ambulatory Visit
Admission: RE | Admit: 2018-10-19 | Discharge: 2018-10-19 | Disposition: A | Discharge status: Home / Self Care | Payer: Self-pay | Source: Ambulatory Visit | Attending: Oncology | Admitting: Oncology

## 2018-10-19 DIAGNOSIS — N63 Unspecified lump in unspecified breast: Secondary | ICD-10-CM | POA: Insufficient documentation

## 2018-10-22 ENCOUNTER — Other Ambulatory Visit: Payer: Self-pay | Admitting: *Deleted

## 2018-10-22 DIAGNOSIS — N63 Unspecified lump in unspecified breast: Secondary | ICD-10-CM

## 2018-10-24 ENCOUNTER — Ambulatory Visit: Admission: RE | Admit: 2018-10-24 | Payer: Self-pay | Source: Ambulatory Visit

## 2018-10-25 ENCOUNTER — Encounter: Payer: Self-pay | Admitting: *Deleted

## 2018-10-25 ENCOUNTER — Ambulatory Visit
Admission: RE | Admit: 2018-10-25 | Discharge: 2018-10-25 | Disposition: A | Discharge status: Home / Self Care | Payer: Self-pay | Source: Ambulatory Visit | Attending: Oncology | Admitting: Oncology

## 2018-10-25 ENCOUNTER — Other Ambulatory Visit: Payer: Self-pay

## 2018-10-25 DIAGNOSIS — N63 Unspecified lump in unspecified breast: Secondary | ICD-10-CM | POA: Insufficient documentation

## 2018-10-25 NOTE — Progress Notes (Signed)
Dr. Marisa Sprinkles from the [facility] called to discuss patients need for antibiotic treatment.  States she removed 50cc of pus from the right breast abscess.  States the abscess is worse, and she will need a different antibiotic, since she has not responded to the Augmentin she was previously on.  I was unable to get in touch with Dr. Everlene Farrier, who has been managing her abscess, but I was able to discuss her case with Dr. Orlie Dakin our [facility] director.  Doxycyline 100 mg by mouth twice a day for 14 days called in to the [facility].  [employer] will pay for the prescription since the patient is uninsured.  Patient came by the cancer center and was given the address to [facility].  Maryjane Hurter, the interpreter was present for interpretation.  She was informed she would need to follow up with Dr. Everlene Farrier in 2 weeks. She has been scheduled to return to see Dr. Everlene Farrier on 11/12/18 @ 2:20.  She was informed of her appointment by Sadie Haber [phone] via the [employer] interpreter.

## 2018-10-25 NOTE — Progress Notes (Signed)
Thank you for the info. She did miss her last appt with me. We will keep her on the schedule

## 2018-11-12 ENCOUNTER — Encounter: Payer: Self-pay | Admitting: Surgery

## 2018-11-12 ENCOUNTER — Ambulatory Visit (INDEPENDENT_AMBULATORY_CARE_PROVIDER_SITE_OTHER): Payer: Self-pay | Admitting: Surgery

## 2018-11-12 ENCOUNTER — Other Ambulatory Visit: Payer: Self-pay

## 2018-11-12 VITALS — BP 117/77 | HR 84 | Temp 98.1°F | Resp 16 | Ht 60.0 in | Wt 159.4 lb

## 2018-11-12 DIAGNOSIS — N6001 Solitary cyst of right breast: Secondary | ICD-10-CM

## 2018-11-12 MED ORDER — SULFAMETHOXAZOLE-TRIMETHOPRIM 800-160 MG PO TABS: 1.0000 | ORAL_TABLET | Freq: Two times a day (BID) | ORAL | 0 refills | Status: DC

## 2018-11-12 NOTE — Patient Instructions (Addendum)
Your Ultrasound of the right breast is scheduled for 11/13/2018 at 2:00 pm.  Please see your follow up appointment listed below.

## 2018-11-13 ENCOUNTER — Ambulatory Visit
Admission: RE | Admit: 2018-11-13 | Discharge: 2018-11-13 | Disposition: A | Discharge status: Home / Self Care | Payer: Self-pay | Source: Ambulatory Visit | Attending: Surgery | Admitting: Surgery

## 2018-11-13 ENCOUNTER — Other Ambulatory Visit: Payer: Self-pay

## 2018-11-13 DIAGNOSIS — N6001 Solitary cyst of right breast: Secondary | ICD-10-CM | POA: Insufficient documentation

## 2018-11-14 ENCOUNTER — Telehealth: Payer: Self-pay

## 2018-11-14 ENCOUNTER — Encounter: Payer: Self-pay | Admitting: Surgery

## 2018-11-14 MED ORDER — ERYTHROMYCIN BASE 500 MG PO TABS: 500.0000 mg | ORAL_TABLET | Freq: Two times a day (BID) | ORAL | 0 refills | Status: DC

## 2018-11-14 NOTE — Telephone Encounter (Signed)
Tried reaching patient through interpreter services. Left message to call office.  Per Dr.Pabon Erythromycin 500 mg BID x 10 days. Prescription has been sent.   Please make an appointment for patient when she calls back, telephone or face to face.

## 2018-11-14 NOTE — Progress Notes (Signed)
Outpatient Surgical Follow Up  11/14/2018  Carolyn Rios is an 43 y.o. female.   Chief Complaint  Patient presents with  . Follow-up    Right breast abcess    HPI: hx recurrent right and left breast abscess. Last one required aspiration on the right side IT GREW CORYNOBACTERIUM SPECIES. She now comes for increase in pain. No fevers or chills. No resp sxs.  Past Medical History:  Diagnosis Date  . No known health problems 04/04/2018    Past Surgical History:  Procedure Laterality Date  . BREAST CYST ASPIRATION Left 03/2018   abscess drainage  . BREAST EXCISIONAL BIOPSY Left 06/2018   Dr Everlene Farrier surgical removed abscess  . INCISION AND DRAINAGE ABSCESS Left 06/28/2018   Procedure: INCISION AND DRAINAGE LEFT BREAST ABSCESS;  Surgeon: Leafy Ro, MD;  Location: [facility] ORS;  Service: General;  Laterality: Left;  . NO PAST SURGERIES      Family History  Problem Relation Age of Onset  . Blindness Mother   . Diabetes Mother   . Diabetes Father   . Hypertension Sister   . Cancer Neg Hx     Social History:  reports that she has never smoked. She has never used smokeless tobacco. She reports that she does not drink alcohol or use drugs.  Allergies: No Known Allergies  Medications reviewed.    ROS Full ROS performed and is otherwise negative other than what is stated in HPI   BP 117/77   Pulse 84   Temp 98.1 F (36.7 C) (Temporal)   Resp 16   Ht 5' (1.524 m)   Wt 159 lb 6.4 oz (72.3 kg)   SpO2 96%   BMI 31.13 kg/m   Physical Exam NAD, alert Breast: RIght side normal w previous scars form formal I/D. Left showing some tenderness, no overt cellulitis or necrotizing infection    No results found for this or any previous visit (from the past 48 hour(s)). US Breast Ltd Uni Right Inc Axilla  Result Date: 11/13/2018 CLINICAL DATA:  43 year old patient has recently had bilateral breast abscesses. She has had a total of 3 right breast aspirations. She has  completed at least 2 courses of antibiotics, and has another prescription for antibiotics that she plans to fill today. The palpable retroareolar fluid collection has returned. The most recent aspiration was October 24, 2018. At that time, approximately 60 cc of pus-like fluid was aspirated. A Spanish interpreter was present throughout the patient's visit today. EXAM: ULTRASOUND OF THE RIGHT BREAST COMPARISON:  October 24, 2018, October 19, 2018, October 08, 2018 FINDINGS: On physical exam, no definite erythema of the right breast is appreciated. The patient is somewhat tender to palpation of a retroareolar mass. Targeted ultrasound is performed, showing a hypoechoic oval-shaped probable complex fluid collection in the retroareolar right breast measuring 5.6 x 3.4 x 4.9 cm. There is some peripheral vascularity. No internal vascularity. Overlying skin thickness is just slightly prominent, measuring up to 2.4 mm. IMPRESSION: Recurrence retroareolar right breast complex fluid collection. RECOMMENDATION: An ultrasound-guided aspiration will be performed today. I have discussed the findings and recommendations with the patient. Results were also provided in writing at the conclusion of the visit. If applicable, a reminder letter will be sent to the patient regarding the next appointment. BI-RADS CATEGORY  3: Probably benign. Electronically Signed   By: Britta Mccreedy M.D.   On: 11/13/2018 15:13   US Breast Aspiration Right  Result Date: 11/13/2018 CLINICAL DATA:  There is  a recurrent retroareolar right breast fluid complex collection. Dr. Everlene Farrier would like a sample of the aspirate sent to the laboratory. EXAM: ULTRASOUND GUIDED RIGHT BREAST CYST ASPIRATION COMPARISON:  Previous exams. PROCEDURE: Using sterile technique, 1% lidocaine, under direct ultrasound visualization, needle aspiration of a retroareolar complex fluid collection was performed. Approximately 48 cc of dark red opaque fluid was returned. Only minimal  residual fluid remained in the breast after the aspiration. IMPRESSION: Ultrasound-guided aspiration of the right breast. No apparent complications. RECOMMENDATIONS: Follow-up with Dr. Everlene Farrier in clinic. As needed, further evaluations of either breast can be performed. Additionally, the patient's annual bilateral mammogram is due in February 2021. Electronically Signed   By: Britta Mccreedy M.D.   On: 11/13/2018 15:16    Assessment/Plan: absces breast, right, schedule needle aspiration by radiology. May require intervention ( FORMAL I/d) is sxs do not resolve w aspiration. No need for emergent surgical intervention at this time. BASED ON Previous cultures and the rarity of corynobacterium I will prescribe 10 day course of erythromycin. Greater than 50% of the 15 minutes  visit was spent in counseling/coordination of care   Sterling Big, MD [facility] General Surgeon

## 2018-11-16 NOTE — Telephone Encounter (Signed)
Patient's daughter is aware and they have already picked up the prescription.

## 2018-11-17 LAB — AEROBIC/ANAEROBIC CULTURE W GRAM STAIN (SURGICAL/DEEP WOUND): Culture: NO GROWTH

## 2018-11-26 ENCOUNTER — Ambulatory Visit: Payer: Self-pay | Admitting: Surgery

## 2018-12-03 ENCOUNTER — Encounter: Payer: Self-pay | Admitting: Surgery

## 2018-12-03 ENCOUNTER — Ambulatory Visit (INDEPENDENT_AMBULATORY_CARE_PROVIDER_SITE_OTHER): Payer: Self-pay | Admitting: Surgery

## 2018-12-03 ENCOUNTER — Other Ambulatory Visit: Payer: Self-pay

## 2018-12-03 VITALS — BP 122/76 | HR 82 | Temp 97.5°F | Ht 60.0 in | Wt 160.0 lb

## 2018-12-03 DIAGNOSIS — N611 Abscess of the breast and nipple: Secondary | ICD-10-CM

## 2018-12-03 NOTE — Patient Instructions (Signed)
Return as needed.The patient is aware to call back for any questions or concerns.  

## 2018-12-03 NOTE — Progress Notes (Signed)
Outpatient Surgical Follow Up  12/03/2018  Carolyn Rios is an 43 y.o. female.   Chief Complaint  Patient presents with  . Follow-up    right breast     HPI: f/u right breast abscess s/p aspiration. U/S personally reviewed showing collection. No growth on final cultures. She is doing much better, no pain, no fevers or chills/   Past Medical History:  Diagnosis Date  . No known health problems 04/04/2018    Past Surgical History:  Procedure Laterality Date  . BREAST CYST ASPIRATION Left 03/2018   abscess drainage  . BREAST EXCISIONAL BIOPSY Left 06/2018   Dr Everlene Farrier surgical removed abscess  . INCISION AND DRAINAGE ABSCESS Left 06/28/2018   Procedure: INCISION AND DRAINAGE LEFT BREAST ABSCESS;  Surgeon: Leafy Ro, MD;  Location: [facility] ORS;  Service: General;  Laterality: Left;  . NO PAST SURGERIES      Family History  Problem Relation Age of Onset  . Blindness Mother   . Diabetes Mother   . Diabetes Father   . Hypertension Sister   . Cancer Neg Hx     Social History:  reports that she has never smoked. She has never used smokeless tobacco. She reports that she does not drink alcohol or use drugs.  Allergies: No Known Allergies  Medications reviewed.    ROS Full ROS performed and is otherwise negative other than what is stated in HPI   BP 122/76   Pulse 82   Temp (!) 97.5 F (36.4 C) (Skin)   Ht 5' (1.524 m)   Wt 160 lb (72.6 kg)   SpO2 97%   BMI 31.25 kg/m   Physical Exam  NAD, awake and alert BREAST: no masses, no tenderness, some firmness around previous cavity . No cellulitis  Abd: soft, nt    Assessment/Plan: Resolved right breast abscess s/p aspiration and A/bs therapy. No need for surgical intervention. Continue yearly mammogram.  Greater than 50% of the 15 minutes  visit was spent in counseling/coordination of care   Sterling Big, MD [facility] General Surgeon

## 2019-01-05 ENCOUNTER — Emergency Department
Admission: EM | Admit: 2019-01-05 | Discharge: 2019-01-05 | Disposition: A | Discharge status: Home / Self Care | Payer: Self-pay | Attending: Emergency Medicine | Admitting: Emergency Medicine

## 2019-01-05 ENCOUNTER — Encounter: Payer: Self-pay | Admitting: Emergency Medicine

## 2019-01-05 DIAGNOSIS — N611 Abscess of the breast and nipple: Secondary | ICD-10-CM | POA: Insufficient documentation

## 2019-01-05 MED ORDER — KETOROLAC TROMETHAMINE 30 MG/ML IJ SOLN
30.0000 mg | Freq: Once | INTRAMUSCULAR | Status: AC
  Administered 2019-01-05: 30 mg via INTRAMUSCULAR
  Filled 2019-01-05: qty 1

## 2019-01-05 MED ORDER — ONDANSETRON HCL 4 MG PO TABS: 4.0000 mg | ORAL_TABLET | Freq: Three times a day (TID) | ORAL | 0 refills | Status: DC | PRN

## 2019-01-05 MED ORDER — CEPHALEXIN 500 MG PO CAPS: 500.0000 mg | ORAL_CAPSULE | Freq: Three times a day (TID) | ORAL | 0 refills | Status: DC

## 2019-01-05 MED ORDER — SULFAMETHOXAZOLE-TRIMETHOPRIM 800-160 MG PO TABS: 1.0000 | ORAL_TABLET | Freq: Two times a day (BID) | ORAL | 0 refills | Status: DC

## 2019-01-05 MED ORDER — HYDROCODONE-ACETAMINOPHEN 5-325 MG PO TABS: 1.0000 | ORAL_TABLET | Freq: Four times a day (QID) | ORAL | 0 refills | Status: DC | PRN

## 2019-01-05 NOTE — ED Provider Notes (Signed)
[facility] Emergency Department Provider Note  ____________________________________________  Time seen: Approximately 10:35 PM  I have reviewed the triage vital signs and the nursing notes.   HISTORY  Chief Complaint breast pain    HPI Carolyn Rios is a 43 y.o. female presents to the emergency department with a right breast abscess that involves the nipple.  Patient has had symptoms for the past 2 to 3 weeks but came to the emergency department tonight because her right breast pain has worsened.  She denies fever and chills.  Patient states that she has had a breast abscess on the left before but never the right.  She denies drainage from the nipple.  She has an appointment with general surgery, Dr. Everlene Farrier on Thursday.  She is here for pain management.        Past Medical History:  Diagnosis Date  . No known health problems 04/04/2018    Patient Active Problem List   Diagnosis Date Noted  . Left breast abscess   . Anemia affecting pregnancy 08/19/2014  . Gestational diabetes mellitus in third trimester 08/19/2014  . Multigravida of advanced maternal age in third trimester 08/19/2014  . Supervision of high-risk pregnancy 08/19/2014    Past Surgical History:  Procedure Laterality Date  . BREAST CYST ASPIRATION Left 03/2018   abscess drainage  . BREAST EXCISIONAL BIOPSY Left 06/2018   Dr Everlene Farrier surgical removed abscess  . INCISION AND DRAINAGE ABSCESS Left 06/28/2018   Procedure: INCISION AND DRAINAGE LEFT BREAST ABSCESS;  Surgeon: Leafy Ro, MD;  Location: [facility] ORS;  Service: General;  Laterality: Left;  . NO PAST SURGERIES      Prior to Admission medications   Medication Sig Start Date End Date Taking? Authorizing Provider  cephALEXin (KEFLEX) 500 MG capsule Take 1 capsule (500 mg total) by mouth 3 (three) times daily for 7 days. 01/05/19 01/12/19  Orvil Feil, PA-C  HYDROcodone-acetaminophen (NORCO) 5-325 MG tablet Take 1  tablet by mouth every 6 (six) hours as needed for up to 3 days for moderate pain. 01/05/19 01/08/19  Orvil Feil, PA-C  ondansetron (ZOFRAN) 4 MG tablet Take 1 tablet (4 mg total) by mouth every 8 (eight) hours as needed for up to 5 days for nausea or vomiting. 01/05/19 01/10/19  Orvil Feil, PA-C  sulfamethoxazole-trimethoprim (BACTRIM DS) 800-160 MG tablet Take 1 tablet by mouth 2 (two) times daily for 7 days. 01/05/19 01/12/19  Orvil Feil, PA-C    Allergies Patient has no known allergies.  Family History  Problem Relation Age of Onset  . Blindness Mother   . Diabetes Mother   . Diabetes Father   . Hypertension Sister   . Cancer Neg Hx     Social History Social History   Tobacco Use  . Smoking status: Never Smoker  . Smokeless tobacco: Never Used  Substance Use Topics  . Alcohol use: Never    Frequency: Never  . Drug use: Never     Review of Systems  Constitutional: No fever/chills Eyes: No visual changes. No discharge ENT: No upper respiratory complaints. Cardiovascular: no chest pain. Respiratory: no cough. No SOB. Gastrointestinal: No abdominal pain.  No nausea, no vomiting.  No diarrhea.  No constipation. Musculoskeletal: Negative for musculoskeletal pain. Skin: Patient has right breast abscess.  Neurological: Negative for headaches, focal weakness or numbness.   ____________________________________________   PHYSICAL EXAM:  VITAL SIGNS: ED Triage Vitals  Enc Vitals Group     BP 01/05/19  2041 123/77     Pulse Rate 01/05/19 2041 (!) 111     Resp 01/05/19 2041 16     Temp 01/05/19 2041 98.2 F (36.8 C)     Temp Source 01/05/19 2041 Oral     SpO2 01/05/19 2041 99 %     Weight --      Height --      Head Circumference --      Peak Flow --      Pain Score 01/05/19 2044 8     Pain Loc --      Pain Edu? --      Excl. in GC? --      Constitutional: Alert and oriented. Well appearing and in no acute distress. Eyes: Conjunctivae are normal.  PERRL. EOMI. Head: Atraumatic. ENT:      Ears: TMs are pearly.       Nose: No congestion/rhinnorhea.      Mouth/Throat: Mucous membranes are moist.  Neck: No stridor.  No cervical spine tenderness to palpation. Cardiovascular: Normal rate, regular rhythm. Normal S1 and S2.  Good peripheral circulation. Respiratory: Normal respiratory effort without tachypnea or retractions. Lungs CTAB. Good air entry to the bases with no decreased or absent breath sounds. Musculoskeletal: Full range of motion to all extremities. No gross deformities appreciated. Neurologic:  Normal speech and language. No gross focal neurologic deficits are appreciated.  Skin: Patient has a 3 cm x 3 cm right breast abscess that involves the nipple.  There is palpable fluctuance and induration. Psychiatric: Mood and affect are normal. Speech and behavior are normal. Patient exhibits appropriate insight and judgement.   ____________________________________________   LABS (all labs ordered are listed, but only abnormal results are displayed)  Labs Reviewed - No data to display ____________________________________________  EKG   ____________________________________________  RADIOLOGY   No results found.  ____________________________________________    PROCEDURES  Procedure(s) performed:    Procedures    Medications  ketorolac (TORADOL) 30 MG/ML injection 30 mg (30 mg Intramuscular Given 01/05/19 2200)     ____________________________________________   INITIAL IMPRESSION / ASSESSMENT AND PLAN / ED COURSE  Pertinent labs & imaging results that were available during my care of the patient were reviewed by me and considered in my medical decision making (see chart for details).  Review of the [place] CSRS was performed in accordance of the [facility] prior to dispensing any controlled drugs.         Assessment and Plan: Right breast abscess Patient presents to the emergency and after developing a right  breast abscess for the past 2 to 3 weeks.  On physical exam, patient has induration and palpable fluctuance.  Patient was discharged with Bactrim, Keflex and Norco.  She was referred to the [facility] for breast ultrasound and ultrasound-guided aspiration.  Patient was advised that if she does not seek care with the [facility] on Monday to keep appointment with general surgery, Dr. Everlene FarrierPabon.  She voiced understanding.  All patient questions were answered.  ____________________________________________  FINAL CLINICAL IMPRESSION(S) / ED DIAGNOSES  Final diagnoses:  Abscess of right breast      NEW MEDICATIONS STARTED DURING THIS VISIT:  ED Discharge Orders         Ordered    sulfamethoxazole-trimethoprim (BACTRIM DS) 800-160 MG tablet  2 times daily     01/05/19 2154    cephALEXin (KEFLEX) 500 MG capsule  3 times daily     01/05/19 2154    HYDROcodone-acetaminophen (NORCO)  5-325 MG tablet  Every 6 hours PRN     01/05/19 2154    ondansetron (ZOFRAN) 4 MG tablet  Every 8 hours PRN     01/05/19 2154              This chart was dictated using voice recognition software/Dragon. Despite best efforts to proofread, errors can occur which can change the meaning. Any change was purely unintentional.    Orvil Feil, PA-C 01/05/19 2240    Phineas Semen, MD 01/05/19 [phone]

## 2019-01-05 NOTE — ED Triage Notes (Signed)
Pt to ED with c/o of right breast pain. Pt states pain for 2-3 weeks but lump developed a few days ago and increased pain.

## 2019-01-08 ENCOUNTER — Encounter: Payer: Self-pay | Admitting: General Surgery

## 2019-01-08 ENCOUNTER — Inpatient Hospital Stay
Admission: AD | Admit: 2019-01-08 | Discharge: 2019-01-10 | DRG: 601 | Disposition: A | Discharge status: Home / Self Care | Payer: Self-pay | Source: Ambulatory Visit | Attending: General Surgery | Admitting: General Surgery

## 2019-01-08 ENCOUNTER — Other Ambulatory Visit: Payer: Self-pay

## 2019-01-08 ENCOUNTER — Ambulatory Visit (INDEPENDENT_AMBULATORY_CARE_PROVIDER_SITE_OTHER): Payer: Self-pay | Admitting: General Surgery

## 2019-01-08 VITALS — BP 154/100 | HR 101 | Temp 97.9°F | Ht 60.0 in | Wt 160.0 lb

## 2019-01-08 DIAGNOSIS — N611 Abscess of the breast and nipple: Secondary | ICD-10-CM

## 2019-01-08 DIAGNOSIS — Z20828 Contact with and (suspected) exposure to other viral communicable diseases: Secondary | ICD-10-CM | POA: Diagnosis present

## 2019-01-08 HISTORY — DX: Abscess of the breast and nipple: N61.1

## 2019-01-08 LAB — CBC WITH DIFFERENTIAL/PLATELET
Abs Immature Granulocytes: 0.08 10*3/uL — ABNORMAL HIGH (ref 0.00–0.07)
Basophils Absolute: 0.1 10*3/uL (ref 0.0–0.1)
Basophils Relative: 1 %
Eosinophils Absolute: 1 10*3/uL — ABNORMAL HIGH (ref 0.0–0.5)
Eosinophils Relative: 9 %
HCT: 41.2 % (ref 36.0–46.0)
Hemoglobin: 13.8 g/dL (ref 12.0–15.0)
Immature Granulocytes: 1 %
Lymphocytes Relative: 24 %
Lymphs Abs: 2.6 10*3/uL (ref 0.7–4.0)
MCH: 31.5 pg (ref 26.0–34.0)
MCHC: 33.5 g/dL (ref 30.0–36.0)
MCV: 94.1 fL (ref 80.0–100.0)
Monocytes Absolute: 0.9 10*3/uL (ref 0.1–1.0)
Monocytes Relative: 8 %
Neutro Abs: 6.6 10*3/uL (ref 1.7–7.7)
Neutrophils Relative %: 57 %
Platelets: 264 10*3/uL (ref 150–400)
RBC: 4.38 MIL/uL (ref 3.87–5.11)
RDW: 13 % (ref 11.5–15.5)
WBC: 11.3 10*3/uL — ABNORMAL HIGH (ref 4.0–10.5)
nRBC: 0 % (ref 0.0–0.2)

## 2019-01-08 LAB — MRSA PCR SCREENING: MRSA by PCR: NEGATIVE

## 2019-01-08 LAB — SARS CORONAVIRUS 2 BY RT PCR (HOSPITAL ORDER, PERFORMED IN ~~LOC~~ HOSPITAL LAB): SARS Coronavirus 2: NEGATIVE

## 2019-01-08 MED ORDER — LACTATED RINGERS IV SOLN
INTRAVENOUS | Status: DC
  Administered 2019-01-08 – 2019-01-09 (×4): via INTRAVENOUS

## 2019-01-08 MED ORDER — PIPERACILLIN-TAZOBACTAM 3.375 G IVPB
3.3750 g | Freq: Four times a day (QID) | INTRAVENOUS | Status: DC
  Administered 2019-01-08 – 2019-01-10 (×7): 3.375 g via INTRAVENOUS
  Filled 2019-01-08 (×7): qty 50

## 2019-01-08 MED ORDER — MORPHINE SULFATE (PF) 2 MG/ML IV SOLN
2.0000 mg | INTRAVENOUS | Status: DC | PRN
  Administered 2019-01-08 – 2019-01-10 (×4): 2 mg via INTRAVENOUS
  Filled 2019-01-08 (×5): qty 1

## 2019-01-08 MED ORDER — HYDROCODONE-ACETAMINOPHEN 5-325 MG PO TABS
1.0000 | ORAL_TABLET | ORAL | Status: DC | PRN
  Administered 2019-01-08 – 2019-01-10 (×5): 1 via ORAL
  Filled 2019-01-08 (×5): qty 1

## 2019-01-08 MED ORDER — SODIUM CHLORIDE 0.9 % IV SOLN
INTRAVENOUS | Status: DC | PRN
  Administered 2019-01-08: 18:00:00 10 mL via INTRAVENOUS
  Administered 2019-01-08: 250 mL via INTRAVENOUS
  Administered 2019-01-09: 19:00:00 20 mL via INTRAVENOUS
  Administered 2019-01-09 (×2): 250 mL via INTRAVENOUS

## 2019-01-08 MED ORDER — ACETAMINOPHEN 325 MG PO TABS
650.0000 mg | ORAL_TABLET | ORAL | Status: DC | PRN
  Administered 2019-01-09: 650 mg via ORAL
  Filled 2019-01-08: qty 2

## 2019-01-08 NOTE — H&P (Signed)
Carolyn RifeSilvia Campbell StallGonzalez Ramirez is an 43 y.o. female.   Chief Complaint: Right breast pain, purulent drainage HPI: This 43 year old woman has had a right breast infection dating back to February of this year.  Incomplete resolution post aspiration under ultrasound guidance.  Worsening pain over the last week.  Presented to the emergency department over the holiday weekend and given a prescription for Bactrim DS.  By report this was going to cost $50 and she was unable to afford the medication.  She came to the office today with purulent material draining from the right breast.  She is admitted for IV antibiotics and operative drainage tomorrow.  The patient was managed for a left breast abscess in fall 2019 with Dr. Elenor LegatoPavone.  Past Medical History:  Diagnosis Date  . No known health problems 04/04/2018    Past Surgical History:  Procedure Laterality Date  . BREAST CYST ASPIRATION Left 03/2018   abscess drainage  . BREAST EXCISIONAL BIOPSY Left 06/2018   Dr Everlene FarrierPabon surgical removed abscess  . INCISION AND DRAINAGE ABSCESS Left 06/28/2018   Procedure: INCISION AND DRAINAGE LEFT BREAST ABSCESS;  Surgeon: Leafy RoPabon, Diego F, MD;  Location: [facility] ORS;  Service: General;  Laterality: Left;  . NO PAST SURGERIES      Family History  Problem Relation Age of Onset  . Blindness Mother   . Diabetes Mother   . Diabetes Father   . Hypertension Sister   . Cancer Neg Hx    Social History:  reports that she has never smoked. She has never used smokeless tobacco. She reports that she does not drink alcohol or use drugs.  Allergies: No Known Allergies  No medications prior to admission.    Results for orders placed or performed during the hospital encounter of 01/08/19 (from the past 48 hour(s))  CBC WITH DIFFERENTIAL     Status: Abnormal   Collection Time: 01/08/19  4:40 PM  Result Value Ref Range   WBC 11.3 (H) 4.0 - 10.5 K/uL   RBC 4.38 3.87 - 5.11 MIL/uL   Hemoglobin 13.8 12.0 - 15.0 g/dL   HCT 16.141.2 09.636.0  - 04.546.0 %   MCV 94.1 80.0 - 100.0 fL   MCH 31.5 26.0 - 34.0 pg   MCHC 33.5 30.0 - 36.0 g/dL   RDW 40.913.0 81.111.5 - 91.415.5 %   Platelets 264 150 - 400 K/uL   nRBC 0.0 0.0 - 0.2 %   Neutrophils Relative % 57 %   Neutro Abs 6.6 1.7 - 7.7 K/uL   Lymphocytes Relative 24 %   Lymphs Abs 2.6 0.7 - 4.0 K/uL   Monocytes Relative 8 %   Monocytes Absolute 0.9 0.1 - 1.0 K/uL   Eosinophils Relative 9 %   Eosinophils Absolute 1.0 (H) 0.0 - 0.5 K/uL   Basophils Relative 1 %   Basophils Absolute 0.1 0.0 - 0.1 K/uL   Immature Granulocytes 1 %   Abs Immature Granulocytes 0.08 (H) 0.00 - 0.07 K/uL    Comment: Performed at [facility], [address] [place], [place] [address]   No results found.  Review of Systems  Constitutional: Negative.  Negative for fever.  HENT: Negative.   Eyes: Negative.   Respiratory: Negative.   Cardiovascular: Negative.   Gastrointestinal: Negative.   Genitourinary: Negative.   Musculoskeletal: Negative.   Neurological: Negative.   Endo/Heme/Allergies: Negative.   Psychiatric/Behavioral: Negative.     Blood pressure 131/90, pulse 85, temperature 98.7 F (37.1 C), temperature source Oral, resp. rate 18,  height 5' (1.524 m), weight 73.2 kg, SpO2 96 %. Physical Exam  Constitutional: She appears well-developed and well-nourished.  HENT:  Head: Normocephalic and atraumatic.  Eyes: Pupils are equal, round, and reactive to light.  Neck: Neck supple. No thyromegaly present.  Cardiovascular: Normal rate and regular rhythm.  Respiratory: Effort normal and breath sounds normal.    Lymphadenopathy:    She has no axillary adenopathy.       Right: No supraclavicular adenopathy present.        Assessment  Right breast abscess with erythema requiring surgical drainage and IV antibiotics.  Plan Admission, IV Zosyn, plan for surgical drainage with Dr. Earlene Plater tomorrow.  Earline Mayotte, MD 01/08/2019, 5:25 PM

## 2019-01-08 NOTE — Patient Instructions (Signed)
The patient is aware to call back for any questions or new concerns.  Direct admit observation to [facility]  surgery for tomorrow

## 2019-01-08 NOTE — Progress Notes (Signed)
Patient ID: Carolyn Rios, female   DOB: 12/09/1975, 43 y.o.   MRN: [id number]  Chief Complaint  Patient presents with  . Wound Check  . Breast Problem    HPI Carolyn Rios is a 43 y.o. female.  Here for right breast drainage. She states she had went to the ED on 01-05-19 for right breast pain and redness that initially started 2 weeks ago. She had an appointment for Thursday. She comes in today for drainage that started today of the right breast.  HPI  Past Medical History:  Diagnosis Date  . No known health problems 04/04/2018    Past Surgical History:  Procedure Laterality Date  . BREAST CYST ASPIRATION Left 03/2018   abscess drainage  . BREAST EXCISIONAL BIOPSY Left 06/2018   Dr Everlene FarrierPabon surgical removed abscess  . INCISION AND DRAINAGE ABSCESS Left 06/28/2018   Procedure: INCISION AND DRAINAGE LEFT BREAST ABSCESS;  Surgeon: Leafy RoPabon, Diego F, MD;  Location: [facility] ORS;  Service: General;  Laterality: Left;  . NO PAST SURGERIES      Family History  Problem Relation Age of Onset  . Blindness Mother   . Diabetes Mother   . Diabetes Father   . Hypertension Sister   . Cancer Neg Hx     Social History Social History   Tobacco Use  . Smoking status: Never Smoker  . Smokeless tobacco: Never Used  Substance Use Topics  . Alcohol use: Never    Frequency: Never  . Drug use: Never    No Known Allergies  No current facility-administered medications for this visit.    No current outpatient medications on file.   Facility-Administered Medications Ordered in Other Visits  Medication Dose Route Frequency Provider Last Rate Last Dose  . 0.9 %  sodium chloride infusion   Intravenous PRN Earline MayotteByrnett, Raquon Milledge W, MD   Stopped at 01/08/19 1933  . acetaminophen (TYLENOL) tablet 650 mg  650 mg Oral Q4H PRN Earline MayotteByrnett, Johsua Shevlin W, MD      . HYDROcodone-acetaminophen (NORCO/VICODIN) 5-325 MG per tablet 1 tablet  1 tablet Oral Q4H PRN Earline MayotteByrnett, Angelene Rome W, MD      . lactated  ringers infusion   Intravenous Continuous Lemar LivingsByrnett, Merrily PewJeffrey W, MD 50 mL/hr at 01/08/19 1959    . morphine 2 MG/ML injection 2 mg  2 mg Intravenous Q2H PRN Earline MayotteByrnett, Nalaya Wojdyla W, MD   2 mg at 01/08/19 1937  . piperacillin-tazobactam (ZOSYN) IVPB 3.375 g  3.375 g Intravenous Q6H Stefan Markarian, Merrily PewJeffrey W, MD   Stopped at 01/08/19 1839    Review of Systems Review of Systems  Constitutional: Negative.   Respiratory: Negative.   Cardiovascular: Negative.     Blood pressure (!) 154/100, pulse (!) 101, temperature 97.9 F (36.6 C), temperature source Temporal, height 5' (1.524 m), weight 160 lb (72.6 kg), SpO2 97 %.  Physical Exam Physical Exam Exam conducted with a chaperone present.  Constitutional:      Appearance: She is well-developed.  Eyes:     General: No scleral icterus.    Conjunctiva/sclera: Conjunctivae normal.  Neck:     Musculoskeletal: Neck supple.  Cardiovascular:     Rate and Rhythm: Normal rate and regular rhythm.     Heart sounds: Normal heart sounds.  Pulmonary:     Effort: Pulmonary effort is normal.     Breath sounds: Normal breath sounds.  Chest:     Breasts:        Right: No inverted nipple, mass, nipple discharge, skin change  or tenderness.     Lymphadenopathy:     Cervical: No cervical adenopathy.     Upper Body:     Right upper body: No supraclavicular or axillary adenopathy.  Skin:    General: Skin is warm and dry.  Neurological:     Mental Status: She is alert and oriented to person, place, and time.  Psychiatric:        Behavior: Behavior normal.     Data Reviewed Prior records.  Assessment Recurrent right breast abscess.  Plan  The patient reports she was told her Bactrim prescriptions provided by the emergency department would be $50, this was out of her price point.  She has shown marked progression over the last 3 days with no relief of pain since spontaneous drainage.  I think she would benefit from IV antibiotics and operative  drainage.  The patient is aware that Dr. Earlene Plater will be completing the procedure and he is amenable to assume her care in Dr. Trixie Deis absence.  A telephone translator was used during the visit.   HPI, assessment, plan and physical exam has been scribed under the direction and in the presence of Earline Mayotte, MD. Dorathy Daft, RN  I have completed the exam and reviewed the above documentation for accuracy and completeness.  I agree with the above.  Museum/gallery conservator has been used and any errors in dictation or transcription are unintentional.  Donnalee Curry, M.D., F.A.C.S.  Merrily Pew Lazariah Savard 01/08/2019, 9:18 PM  Patient was directly admitted to [facility].   Plans for surgery for tomorrow, 01-09-19 with Dr. Earlene Plater.   Nicholes Mango, CMA

## 2019-01-09 ENCOUNTER — Observation Stay: Payer: Self-pay | Admitting: Certified Registered Nurse Anesthetist

## 2019-01-09 ENCOUNTER — Ambulatory Visit: Admit: 2019-01-09 | Payer: Self-pay | Admitting: Surgery

## 2019-01-09 ENCOUNTER — Encounter: Payer: Self-pay | Admitting: *Deleted

## 2019-01-09 ENCOUNTER — Encounter
Admission: AD | Disposition: A | Discharge status: Home / Self Care | Payer: Self-pay | Source: Ambulatory Visit | Attending: General Surgery

## 2019-01-09 DIAGNOSIS — N611 Abscess of the breast and nipple: Principal | ICD-10-CM

## 2019-01-09 HISTORY — PX: INCISION AND DRAINAGE ABSCESS: SHX5864

## 2019-01-09 LAB — PREGNANCY, URINE: Preg Test, Ur: NEGATIVE

## 2019-01-09 SURGERY — INCISION AND DRAINAGE, ABSCESS
Anesthesia: General | Site: Breast | Laterality: Right

## 2019-01-09 MED ORDER — ALBUTEROL SULFATE HFA 108 (90 BASE) MCG/ACT IN AERS
INHALATION_SPRAY | RESPIRATORY_TRACT | Status: DC | PRN
  Administered 2019-01-09: 4 via RESPIRATORY_TRACT

## 2019-01-09 MED ORDER — MEPERIDINE HCL 50 MG/ML IJ SOLN: 6.2500 mg | INTRAMUSCULAR | Status: DC | PRN

## 2019-01-09 MED ORDER — PROPOFOL 10 MG/ML IV BOLUS
INTRAVENOUS | Status: AC
  Filled 2019-01-09: qty 20

## 2019-01-09 MED ORDER — LIDOCAINE HCL 1 % IJ SOLN
INTRAMUSCULAR | Status: DC | PRN
  Administered 2019-01-09: 5 mL

## 2019-01-09 MED ORDER — FENTANYL CITRATE (PF) 100 MCG/2ML IJ SOLN
25.0000 ug | INTRAMUSCULAR | Status: DC | PRN
  Administered 2019-01-09 (×4): 25 ug via INTRAVENOUS

## 2019-01-09 MED ORDER — OXYCODONE HCL 5 MG PO TABS
5.0000 mg | ORAL_TABLET | Freq: Once | ORAL | Status: AC | PRN
  Administered 2019-01-09: 5 mg via ORAL

## 2019-01-09 MED ORDER — DEXAMETHASONE SODIUM PHOSPHATE 10 MG/ML IJ SOLN
INTRAMUSCULAR | Status: DC | PRN
  Administered 2019-01-09: 10 mg via INTRAVENOUS

## 2019-01-09 MED ORDER — PROPOFOL 10 MG/ML IV BOLUS
INTRAVENOUS | Status: DC | PRN
  Administered 2019-01-09: 160 mg via INTRAVENOUS
  Administered 2019-01-09: 30 mg via INTRAVENOUS

## 2019-01-09 MED ORDER — FENTANYL CITRATE (PF) 100 MCG/2ML IJ SOLN
INTRAMUSCULAR | Status: AC
  Administered 2019-01-09: 10:00:00 25 ug via INTRAVENOUS
  Filled 2019-01-09: qty 2

## 2019-01-09 MED ORDER — SUCCINYLCHOLINE CHLORIDE 20 MG/ML IJ SOLN
INTRAMUSCULAR | Status: DC | PRN
  Administered 2019-01-09: 100 mg via INTRAVENOUS

## 2019-01-09 MED ORDER — ONDANSETRON HCL 4 MG/2ML IJ SOLN
INTRAMUSCULAR | Status: AC
  Filled 2019-01-09: qty 2

## 2019-01-09 MED ORDER — MIDAZOLAM HCL 2 MG/2ML IJ SOLN
INTRAMUSCULAR | Status: DC | PRN
  Administered 2019-01-09: 1 mg via INTRAVENOUS

## 2019-01-09 MED ORDER — FENTANYL CITRATE (PF) 100 MCG/2ML IJ SOLN
INTRAMUSCULAR | Status: DC | PRN
  Administered 2019-01-09: 25 ug via INTRAVENOUS
  Administered 2019-01-09: 50 ug via INTRAVENOUS
  Administered 2019-01-09: 25 ug via INTRAVENOUS

## 2019-01-09 MED ORDER — ONDANSETRON HCL 4 MG/2ML IJ SOLN
INTRAMUSCULAR | Status: DC | PRN
  Administered 2019-01-09: 4 mg via INTRAVENOUS

## 2019-01-09 MED ORDER — SUCCINYLCHOLINE CHLORIDE 20 MG/ML IJ SOLN
INTRAMUSCULAR | Status: AC
  Filled 2019-01-09: qty 1

## 2019-01-09 MED ORDER — IPRATROPIUM-ALBUTEROL 0.5-2.5 (3) MG/3ML IN SOLN
RESPIRATORY_TRACT | Status: AC
  Filled 2019-01-09: qty 3

## 2019-01-09 MED ORDER — ONDANSETRON HCL 4 MG/2ML IJ SOLN
4.0000 mg | Freq: Four times a day (QID) | INTRAMUSCULAR | Status: DC | PRN
  Administered 2019-01-09 (×2): 4 mg via INTRAVENOUS
  Filled 2019-01-09 (×2): qty 2

## 2019-01-09 MED ORDER — LIDOCAINE HCL (PF) 2 % IJ SOLN
INTRAMUSCULAR | Status: AC
  Filled 2019-01-09: qty 10

## 2019-01-09 MED ORDER — IPRATROPIUM-ALBUTEROL 0.5-2.5 (3) MG/3ML IN SOLN
3.0000 mL | Freq: Once | RESPIRATORY_TRACT | Status: AC
  Administered 2019-01-09: 10:00:00 3 mL via RESPIRATORY_TRACT

## 2019-01-09 MED ORDER — BUPIVACAINE-EPINEPHRINE (PF) 0.5% -1:200000 IJ SOLN
INTRAMUSCULAR | Status: DC | PRN
  Administered 2019-01-09: 5 mL via PERINEURAL
  Administered 2019-01-09: 30 mL via PERINEURAL

## 2019-01-09 MED ORDER — OXYCODONE HCL 5 MG PO TABS
ORAL_TABLET | ORAL | Status: AC
  Administered 2019-01-09: 11:00:00 5 mg
  Filled 2019-01-09: qty 1

## 2019-01-09 MED ORDER — MIDAZOLAM HCL 2 MG/2ML IJ SOLN
INTRAMUSCULAR | Status: AC
  Filled 2019-01-09: qty 2

## 2019-01-09 MED ORDER — OXYCODONE HCL 5 MG/5ML PO SOLN: 5.0000 mg | Freq: Once | ORAL | Status: AC | PRN

## 2019-01-09 MED ORDER — DEXAMETHASONE SODIUM PHOSPHATE 10 MG/ML IJ SOLN
INTRAMUSCULAR | Status: AC
  Filled 2019-01-09: qty 2

## 2019-01-09 MED ORDER — FENTANYL CITRATE (PF) 100 MCG/2ML IJ SOLN
INTRAMUSCULAR | Status: AC
  Filled 2019-01-09: qty 2

## 2019-01-09 MED ORDER — PROMETHAZINE HCL 25 MG/ML IJ SOLN: 6.2500 mg | INTRAMUSCULAR | Status: DC | PRN

## 2019-01-09 MED ORDER — LIDOCAINE HCL (CARDIAC) PF 100 MG/5ML IV SOSY
PREFILLED_SYRINGE | INTRAVENOUS | Status: DC | PRN
  Administered 2019-01-09: 100 mg via INTRAVENOUS

## 2019-01-09 SURGICAL SUPPLY — 30 items
BLADE SURG 11 STRL SS SAFETY (MISCELLANEOUS) ×3 IMPLANT
BRIEF STRETCH MATERNITY 2XLG (MISCELLANEOUS) ×3 IMPLANT
COVER WAND RF STERILE (DRAPES) ×3 IMPLANT
DRAIN PENROSE 1/4X12 LTX (DRAIN) ×3 IMPLANT
DRAPE LAPAROTOMY 100X77 ABD (DRAPES) ×3 IMPLANT
DRAPE UTILITY 15X26 TOWEL STRL (DRAPES) ×3 IMPLANT
ELECT CAUTERY BLADE 6.4 (BLADE) ×3 IMPLANT
ELECT REM PT RETURN 9FT ADLT (ELECTROSURGICAL) ×3
ELECTRODE REM PT RTRN 9FT ADLT (ELECTROSURGICAL) ×1 IMPLANT
GAUZE 4X4 16PLY RFD (DISPOSABLE) ×3 IMPLANT
GAUZE PACKING IODOFORM 1/2 (PACKING) ×3 IMPLANT
GAUZE SPONGE 4X4 12PLY STRL (GAUZE/BANDAGES/DRESSINGS) ×3 IMPLANT
GLOVE BIO SURGEON STRL SZ7 (GLOVE) ×3 IMPLANT
GLOVE BIOGEL PI IND STRL 7.5 (GLOVE) ×1 IMPLANT
GLOVE BIOGEL PI INDICATOR 7.5 (GLOVE) ×2
GOWN STRL REUS W/ TWL LRG LVL3 (GOWN DISPOSABLE) ×2 IMPLANT
GOWN STRL REUS W/TWL LRG LVL3 (GOWN DISPOSABLE) ×4
KIT TURNOVER KIT A (KITS) ×3 IMPLANT
NEEDLE HYPO 22GX1.5 SAFETY (NEEDLE) ×3 IMPLANT
NS IRRIG 500ML POUR BTL (IV SOLUTION) ×3 IMPLANT
PACK BASIN MINOR ARMC (MISCELLANEOUS) ×3 IMPLANT
PAD ABD DERMACEA PRESS 5X9 (GAUZE/BANDAGES/DRESSINGS) ×6 IMPLANT
SUT ETHILON 3-0 FS-10 30 BLK (SUTURE) ×3
SUT VIC AB 3-0 SH 27 (SUTURE) ×2
SUT VIC AB 3-0 SH 27X BRD (SUTURE) ×1 IMPLANT
SUTURE EHLN 3-0 FS-10 30 BLK (SUTURE) ×1 IMPLANT
SWAB DUAL CULTURE TRANS RED ST (MISCELLANEOUS) ×3 IMPLANT
SYR 10ML LL (SYRINGE) ×3 IMPLANT
SYR 20CC LL (SYRINGE) ×3 IMPLANT
SYR BULB 3OZ (MISCELLANEOUS) ×3 IMPLANT

## 2019-01-09 NOTE — Progress Notes (Signed)
The patient was not able to eat dinner. She was nauseous and asked for nausea medication which was given. Attempted to eat a fruit cup yet was nauseous again. Nausea is intermittent. Pain has been and issues as well yet seems to be improving slightly. Voiding. Dressing changed around incision has been done once since returning from her procedure.

## 2019-01-09 NOTE — Anesthesia Procedure Notes (Signed)
Procedure Name: Intubation Date/Time: 01/09/2019 9:11 AM Performed by: Ginger Carne, CRNA Pre-anesthesia Checklist: Patient identified, Emergency Drugs available, Suction available, Patient being monitored and Timeout performed Patient Re-evaluated:Patient Re-evaluated prior to induction Oxygen Delivery Method: Circle system utilized Preoxygenation: Pre-oxygenation with 100% oxygen Induction Type: Combination inhalational/ intravenous induction Ventilation: Mask ventilation without difficulty Laryngoscope Size: Miller and 2 Grade View: Grade I Tube type: Oral Tube size: 7.5 mm Number of attempts: 1 Airway Equipment and Method: Stylet Placement Confirmation: ETT inserted through vocal cords under direct vision,  positive ETCO2 and breath sounds checked- equal and bilateral Secured at: 20 cm Tube secured with: Tape Dental Injury: Teeth and Oropharynx as per pre-operative assessment  Difficulty Due To: Difficulty was unanticipated Comments: LMA removed due to in adequate volumes.

## 2019-01-09 NOTE — Op Note (Addendum)
SURGICAL OPERATIVE REPORT  DATE OF PROCEDURE: 01/09/2019  ATTENDING Surgeon(s): Ancil Linsey, MD  ANESTHESIA: GETA (initially LMA, intubated intra-op due to wheezing and desaturation prior to timeout)  PRE-OPERATIVE DIAGNOSIS: Right breast abscess (icd-10's: N61.1)  POST-OPERATIVE DIAGNOSIS: Right breast abscess (icd-10's: N61.1)  PROCEDURE(S):  1.) Incision and drainage of complex/loculated Riught breast abscess(cpt: 19020)  INTRAOPERATIVE FINDINGS: a clearly evident Right breast subareolar loculated 2+ cm cavity and adjacent 1 cm cavity containing dark bloody not-obviously-purulent fluid  INTRAVENOUS FLUIDS: 900 mL crystalloid   ESTIMATED BLOOD LOSS: Minimal (<20 mL)   URINE OUTPUT: No Foley catheter   SPECIMENS: Contents of Right breast abscess for culture  IMPLANTS: None  DRAINS: Penrose drain in the Right breast abscess cavity  COMPLICATIONS: None apparent  CONDITION AT END OF PROCEDURE: Hemodynamically stable and extubated  DISPOSITION OF PATIENT: PACU  INDICATIONS FOR PROCEDURE:  Patient is a 43 y.o. female who was admitted to [facility] from [facility] surgical office worsened Right breast pain despite spontaneous Right areolar purulent drainage and erythema/swelling x 2 weeks, for which patient was prescribed oral antibiotics, but says she was unable to afford the medication and did not take it accordingly. All risks, benefits, and alternatives to incision and drainage of Right breast abscess were discussed with the patient, all of patient's questions were answered to her expressed satisfaction, and informed consent was obtained and documented. Of note, patient previously underwent Right breast ultrasound with needle aspiration of retroareolar fluid collection (11/13/2018) and incision and drainage of Left breast abscess (Pabon, 06/2018).  DETAILS OF PROCEDURE: Patient was brought to the operating suite and  appropriately identified. General anesthesia was administered along with appropriate pre-operative antibiotics, and LMA was placed. In supine position, operative site was prepped and draped in the usual sterile fashion. Prior to timeout, patient demonstrated wheezing with desaturation, which prompted endotracheal intubation to be performed by anesthetist. Following a brief time out, the focus of maximal fluctuance was identified and draining 3 mm areolar wound was extended 2 cm using a #15 blade scalpel. Intra-cavitary septations/loculations were then disrupted using a hemostat and sterile gloved finger, after which deep fluid culture was obtained and sent. Further intra-cavitary loculations/septations were similarly disrupted, cavity was debrided and irrigated, and direct pressure was applied for hemostasis with selective electrocautery. A 1/4" Penrose drain was directed into the largest cavity and secured using a single 3-0 nylon suture. Buried interrupted 3-0 Vicryl suture x2 were used to loosely re-approximate adjacent dermis, confirmed to be draining adequately via Penrose drain with noticeably reduced surrounding erythema. Surrounding skin was then cleaned and dried, and a sterile dry gauze and adhesive dressing was applied. Patient was then safely able to be extubated, awakened, and transferred to PACU for post-operative monitoring and care.  I was present for all aspects of the above procedure, and no operative complications were apparent.

## 2019-01-09 NOTE — Interval H&P Note (Signed)
History and Physical Interval Note:  01/09/2019 8:15 AM  Carolyn Rios [name]  has presented today for surgery, with the diagnosis of Right Breast Abscess.  The various methods of treatment have been discussed with the patient and family. After consideration of risks, benefits and other options for treatment, the patient has consented to  Procedure(s): INCISION AND DRAINAGE ABSCESS Breast (Right) as a surgical intervention.  The patient's history has been reviewed, patient examined, no change in status, stable for surgery.  I have reviewed the patient's chart and labs.  Questions were answered to the patient's satisfaction.     Ancil Linsey

## 2019-01-09 NOTE — Anesthesia Preprocedure Evaluation (Signed)
Anesthesia Evaluation  Patient identified by MRN, date of birth, ID band Patient awake    Reviewed: Allergy & Precautions, NPO status , Patient's Chart, lab work & pertinent test results  History of Anesthesia Complications Negative for: history of anesthetic complications  Airway Mallampati: III  TM Distance: >3 FB Neck ROM: Full    Dental  (+) Poor Dentition   Pulmonary neg pulmonary ROS, neg sleep apnea, neg COPD,    breath sounds clear to auscultation- rhonchi (-) wheezing      Cardiovascular Exercise Tolerance: Good (-) hypertension(-) CAD, (-) Past MI, (-) Cardiac Stents and (-) CABG  Rhythm:Regular Rate:Normal - Systolic murmurs and - Diastolic murmurs    Neuro/Psych neg Seizures negative neurological ROS  negative psych ROS   GI/Hepatic negative GI ROS, Neg liver ROS,   Endo/Other  negative endocrine ROSneg diabetes  Renal/GU negative Renal ROS     Musculoskeletal negative musculoskeletal ROS (+)   Abdominal (+) + obese,   Peds  Hematology  (+) anemia ,   Anesthesia Other Findings Breast abscess   Reproductive/Obstetrics                             Anesthesia Physical Anesthesia Plan  ASA: II  Anesthesia Plan: General   Post-op Pain Management:    Induction: Intravenous  PONV Risk Score and Plan: 2 and Ondansetron, Dexamethasone and Midazolam  Airway Management Planned: LMA  Additional Equipment:   Intra-op Plan:   Post-operative Plan:   Informed Consent: I have reviewed the patients History and Physical, chart, labs and discussed the procedure including the risks, benefits and alternatives for the proposed anesthesia with the patient or authorized representative who has indicated his/her understanding and acceptance.     Dental advisory given  Plan Discussed with: CRNA and Anesthesiologist  Anesthesia Plan Comments:         Anesthesia Quick  Evaluation

## 2019-01-09 NOTE — Anesthesia Post-op Follow-up Note (Signed)
Anesthesia QCDR form completed.        

## 2019-01-09 NOTE — Anesthesia Postprocedure Evaluation (Signed)
Anesthesia Post Note  Patient: Carolyn Rios  Procedure(s) Performed: INCISION AND DRAINAGE ABSCESS Breast (Right Breast)  Patient location during evaluation: PACU Anesthesia Type: General Level of consciousness: awake and alert and oriented Pain management: pain level controlled Vital Signs Assessment: post-procedure vital signs reviewed and stable Respiratory status: spontaneous breathing, nonlabored ventilation and respiratory function stable Cardiovascular status: blood pressure returned to baseline and stable Postop Assessment: no signs of nausea or vomiting Anesthetic complications: no     Last Vitals:  Vitals:   01/09/19 1050 01/09/19 1112  BP: (!) 133/96 (!) 124/95  Pulse: 95 89  Resp: 20 16  Temp:  37.1 C  SpO2: 97% 98%    Last Pain:  Vitals:   01/09/19 1112  TempSrc: Oral  PainSc:                  Ekta Dancer

## 2019-01-09 NOTE — Anesthesia Procedure Notes (Signed)
Procedure Name: LMA Insertion Date/Time: 01/09/2019 8:58 AM Performed by: Ginger Carne, CRNA Pre-anesthesia Checklist: Patient identified, Emergency Drugs available, Suction available, Patient being monitored and Timeout performed Patient Re-evaluated:Patient Re-evaluated prior to induction Oxygen Delivery Method: Circle system utilized Preoxygenation: Pre-oxygenation with 100% oxygen Induction Type: IV induction LMA: LMA inserted LMA Size: 3.5 Tube type: Oral Number of attempts: 1 Placement Confirmation: positive ETCO2 and breath sounds checked- equal and bilateral Tube secured with: Tape Dental Injury: Teeth and Oropharynx as per pre-operative assessment

## 2019-01-09 NOTE — Transfer of Care (Signed)
Immediate Anesthesia Transfer of Care Note  Patient: Carolyn Rios  Procedure(s) Performed: INCISION AND DRAINAGE ABSCESS Breast (Right Breast)  Patient Location: PACU  Anesthesia Type:General  Level of Consciousness: awake, alert  and oriented  Airway & Oxygen Therapy: Patient Spontanous Breathing and Patient connected to face mask oxygen  Post-op Assessment: Report given to RN and Post -op Vital signs reviewed and stable  Post vital signs: Reviewed and stable  Last Vitals:  Vitals Value Taken Time  BP 138/91 01/09/2019  9:50 AM  Temp 36.6 C 01/09/2019  9:50 AM  Pulse 103 01/09/2019  9:52 AM  Resp 14 01/09/2019  9:52 AM  SpO2 99 % 01/09/2019  9:52 AM  Vitals shown include unvalidated device data.  Last Pain:  Vitals:   01/09/19 0812  TempSrc: Temporal  PainSc: 8       Patients Stated Pain Goal: 0 (01/08/19 2352)  Complications: No apparent anesthesia complications

## 2019-01-10 ENCOUNTER — Ambulatory Visit: Payer: Self-pay | Admitting: Surgery

## 2019-01-10 MED ORDER — SULFAMETHOXAZOLE-TRIMETHOPRIM 800-160 MG PO TABS: 1.0000 | ORAL_TABLET | Freq: Two times a day (BID) | ORAL | 0 refills | Status: DC

## 2019-01-10 MED ORDER — OXYCODONE HCL 5 MG PO TABS: 5.0000 mg | ORAL_TABLET | Freq: Four times a day (QID) | ORAL | 0 refills | Status: DC | PRN

## 2019-01-10 NOTE — Progress Notes (Signed)
Susy Frizzle to be D/C'd Home per MD order.  Discussed prescriptions and follow up appointments with the patient. Prescriptions given to patient, medication list explained in detail. Pt verbalized understanding.  Allergies as of 01/10/2019   No Known Allergies     Medication List    TAKE these medications   oxyCODONE 5 MG immediate release tablet Commonly known as:  Oxy IR/ROXICODONE Take 1 tablet (5 mg total) by mouth every 6 (six) hours as needed for severe pain.   sulfamethoxazole-trimethoprim 800-160 MG tablet Commonly known as:  BACTRIM DS Take 1 tablet by mouth 2 (two) times daily for 10 days.       Vitals:   01/09/19 2046 01/10/19 0524  BP: (!) 137/92 119/83  Pulse: 94 75  Resp: 20 20  Temp: 98.1 F (36.7 C) 98.4 F (36.9 C)  SpO2: 97% 98%    Skin clean, dry and intact without evidence of skin break down, no evidence of skin tears noted. IV catheter discontinued intact. Site without signs and symptoms of complications. Dressing and pressure applied. Pt denies pain at this time. No complaints noted.  An After Visit Summary was printed and given to the patient. Patient escorted via WC, and D/C home via private auto.  Madie Reno, RN

## 2019-01-10 NOTE — Discharge Summary (Signed)
[facility] SURGICAL DISCHARGE SUMMARY   Patient ID: Carolyn Rios MRN: [id number] DOB/AGE: 1975-10-18 43 y.o.  Admit date: 01/08/2019 Discharge date: 01/10/2019  Discharge Diagnoses Left Breast Abscess  Consultants None  Procedures 01/09/2019:  Incision and drainage of complex/loculated leftbreastabscess   HPI: Carolyn Rios is a 43 y.o. female with a history of right breast infection/abscess who had been following Dr Everlene Farrier for the same. She had been managing well and then developed worsening pain/drainage on 05/23. She presented to the ED and was given bactrim however this was never filled secondary to cost. No reports of fever, chills. She returned to clinic on 05/26 and was direct admitted to the hospital for IV ABx and I&D after worsening pain and drainage from the right breast.   Hospital Course: Informed consent was obtained and documented, and patient underwent uneventful incision and drainage of complex/loculated leftbreastabscess (Dr Earlene Plater, 01/09/2019).  Post-operatively, patient's pain/nausea improved/resolved and advancement of patient's diet and ambulation were well-tolerated. The remainder of patient's hospital course was essentially unremarkable, and discharge planning was initiated accordingly with patient safely able to be discharged home with appropriate discharge instructions, antibiotics (Bactrim x 10 days), pain control, and outpatient follow-up after all of her questions were answered to her expressed satisfaction.  Discharge Condition: Good   Physical Examination:  Constitutional: Well appearing female, NAD Pulmonary: Normal effort, no respiratory distress Right Breast: Penrose in the 4-5 o'clock position to the nipple, no surrounding erythema, minimal tenderness to palpation, no purulent drainage.    Allergies as of 01/10/2019   No Known Allergies     Medication List    TAKE these medications   oxyCODONE 5 MG  immediate release tablet Commonly known as:  Oxy IR/ROXICODONE Take 1 tablet (5 mg total) by mouth every 6 (six) hours as needed for severe pain.   sulfamethoxazole-trimethoprim 800-160 MG tablet Commonly known as:  BACTRIM DS Take 1 tablet by mouth 2 (two) times daily for 10 days.        Follow-up Information    Pabon, [place] F, MD. Schedule an appointment as soon as possible for a visit in 10 day(s).   Specialty:  General Surgery Why:  s/p I&D of right breast abscess, has penrose drain, will need face to face Contact information: [address] [place] [address] [phone]            Time spent on discharge management including discussion of hospital course, clinical condition, outpatient instructions, prescriptions, and follow up with the patient and members of the medical team: >30 minutes  -- Lynden Oxford , PA-C [facility]  01/10/2019, 10:22 AM [phone] M-F: 7am - 4pm

## 2019-01-10 NOTE — TOC Transition Note (Signed)
Transition of Care [facility]) - CM/SW Discharge Note   Patient Details  Name: Carolyn Rios MRN: [id number] Date of Birth: 04-09-1976  [facility] CM/SW Contact:  Chapman Fitch, RN Phone Number: 01/10/2019, 3:16 PM   Clinical Narrative:    Patient status post I&D left breast abscess.  Assessment completed with patient via inerpreter.    Patient lives at home with children.  No PCP.  Follow up appointment made with [facility].   Patient denies issues with transportation.   Patient to discharge on antibiotics and pain medication.  Patient states that she uses [facility].  Antibiotic $9.02 with goodrx coupon.  Coupon for pain medication not available.  Patient denies issues obtaining medications    Final next level of care: Home/Self Care Barriers to Discharge: Barriers Resolved   Patient Goals and CMS Choice        Discharge Placement                       Discharge Plan and Services   Discharge Planning Services: CM Consult                                 Social Determinants of Health (SDOH) Interventions     Readmission Risk Interventions No flowsheet data found.

## 2019-01-14 LAB — AEROBIC/ANAEROBIC CULTURE W GRAM STAIN (SURGICAL/DEEP WOUND): Culture: NO GROWTH

## 2019-01-17 ENCOUNTER — Other Ambulatory Visit: Payer: Self-pay

## 2019-01-17 ENCOUNTER — Ambulatory Visit (INDEPENDENT_AMBULATORY_CARE_PROVIDER_SITE_OTHER): Payer: Self-pay | Admitting: Surgery

## 2019-01-17 ENCOUNTER — Encounter: Payer: Self-pay | Admitting: Surgery

## 2019-01-17 VITALS — BP 114/78 | HR 88 | Temp 97.9°F | Resp 14 | Ht 60.0 in | Wt 162.0 lb

## 2019-01-17 DIAGNOSIS — Z4889 Encounter for other specified surgical aftercare: Secondary | ICD-10-CM

## 2019-01-17 DIAGNOSIS — N611 Abscess of the breast and nipple: Secondary | ICD-10-CM

## 2019-01-17 NOTE — Patient Instructions (Signed)
You may shower as usual using soap and water to the wound,  however do not submerge your breast under water.   Please keep a gauze dressing over the area to collect any drainage that you may have.   Please call if you have any questions or concerns.

## 2019-01-17 NOTE — Progress Notes (Signed)
Surgical Clinic Progress/Follow-up Note   HPI:  43 y.o. Female presents to clinic for post-op follow-up 8 Days s/p incision and drainage of Right breast infected hematoma/abscess Earlene Plater(Kassandra Meriweather, 01/09/2019) refractory to prior formal ultrasound-guided needle aspiration. Patient reports she felt a little soreness immediately after surgery, but says her pain was immediately much better than what she'd experienced prior to surgery and has since been without any pain. She completed her prescribed antibiotics and denies any further purulent drainage with only a small amount of thin clear to slightly red liquid from her Penrose drain placed at time of her recent procedure. She otherwise denies any breast mass, nipple discharge, fever/chills, N/V, CP, or SOB.  Review of Systems:  Constitutional: denies fever/chills  Respiratory: denies shortness of breath, wheezing  Cardiovascular: denies chest pain, palpitations  Breast: pain, mass(es), and nipple discharge as per interval history Skin: Denies any other rashes or skin discolorations except post-surgical wounds as per interval history  Vital Signs:  BP 114/78   Pulse 88   Temp 97.9 F (36.6 C) (Temporal)   Resp 14   Ht 5' (1.524 m)   Wt 162 lb (73.5 kg)   SpO2 96%   BMI 31.64 kg/m    Physical Exam:  Constitutional:  -- Overweight body habitus  -- Awake, alert, and oriented x3  Pulmonary:  -- No crackles -- Equal breath sounds bilaterally -- Breathing non-labored at rest Cardiovascular:  -- S1, S2 present  -- No pericardial rubs  Breast: -- Right breast Penrose drain well-secured without any appreciable drainage -- Incision otherwise loosely approximated without surrounding erythema or drainage -- Mild discomfort with removing drain suture, but otherwise non-tender to palpation  -- No Right breast mass(es) or nipple discharge appreciated Musculoskeletal / Integumentary:  -- Wounds or skin discoloration: None appreciated except  post-surgical incisions as described above (Breast) -- Extremities: B/L UE and LE FROM, hands and feet warm, no edema   Deep fluid collection culture (01/09/2019):  Gram Stain FEW WBC PRESENT, PREDOMINANTLY MONONUCLEAR  NO ORGANISMS SEEN  MODERATE RED BLOOD CELLS PRESENT   Culture No growth aerobically or anaerobically.    Imaging: No new pertinent imaging available for review  Assessment:  43 y.o. yo Female with a problem list including...  Patient Active Problem List   Diagnosis Date Noted  . Breast abscess of female 01/08/2019  . Abscess of right breast 01/08/2019  . Left breast abscess   . Anemia affecting pregnancy 08/19/2014  . Gestational diabetes mellitus in third trimester 08/19/2014  . Multigravida of advanced maternal age in third trimester 08/19/2014  . Supervision of high-risk pregnancy 08/19/2014    presents to clinic for post-op follow-up evaluation, doing very well 8 Days s/p incision and drainage of Right breast infected hematoma/abscess Earlene Plater(Jahmier Willadsen, 01/09/2019) refractory to prior formal ultrasound-guided needle aspiration.  Plan:              - Right breast Penrose drain removed   - procedural culture results were discussed             - gradually resume all activities without restrictions over next 2 weeks  - may remove dressing in 1 - 2 days and reapply as needed for any drainage             - okay to shower with soap and water, but do not submerge incisions under water (baths, swimming) until wound has completely healed             - apply sunblock particularly  to incisions with sun exposure to reduce pigmentation of scars  - additional post-surgical wound follow-up was offered, but patient expresses preference to follow-up as needed             - return to clinic as needed, instructed to call office if any questions or concerns  All of the above recommendations were discussed with the patient, and all of patient's questions were answered to her expressed  satisfaction.  -- Scherrie Gerlach Earlene Plater, MD, RPVI [place]: [facility] General Surgery - Partnering for exceptional care. Office: [phone]

## 2019-08-21 ENCOUNTER — Other Ambulatory Visit: Payer: Self-pay

## 2019-08-21 ENCOUNTER — Ambulatory Visit: Payer: Self-pay | Admitting: Physician Assistant

## 2019-08-21 DIAGNOSIS — N760 Acute vaginitis: Secondary | ICD-10-CM

## 2019-08-21 DIAGNOSIS — B9689 Other specified bacterial agents as the cause of diseases classified elsewhere: Secondary | ICD-10-CM

## 2019-08-21 DIAGNOSIS — Z113 Encounter for screening for infections with a predominantly sexual mode of transmission: Secondary | ICD-10-CM

## 2019-08-21 DIAGNOSIS — B3731 Acute candidiasis of vulva and vagina: Secondary | ICD-10-CM

## 2019-08-21 DIAGNOSIS — B373 Candidiasis of vulva and vagina: Secondary | ICD-10-CM

## 2019-08-21 LAB — WET PREP FOR TRICH, YEAST, CLUE
Trichomonas Exam: NEGATIVE
Yeast Exam: NEGATIVE

## 2019-08-21 MED ORDER — CLOTRIMAZOLE 1 % VA CREA: 1.0000 | TOPICAL_CREAM | Freq: Every day | VAGINAL | 0 refills | Status: AC

## 2019-08-21 MED ORDER — CLOTRIMAZOLE 1 % EX SOLN: 1.0000 "application " | Freq: Two times a day (BID) | CUTANEOUS | 0 refills | Status: DC

## 2019-08-21 MED ORDER — METRONIDAZOLE 500 MG PO TABS: 500.0000 mg | ORAL_TABLET | Freq: Two times a day (BID) | ORAL | 0 refills | Status: AC

## 2019-08-21 NOTE — Progress Notes (Signed)
Wet mount reviewed with provider and pt treated for BV per Sadie Haber, PA order. Pt also received Clotrimazole Vaginal cream, and Clotrimazole topical cream per Sadie Haber, PA order. Provider orders completed.Lyman Speller, RN

## 2019-08-22 ENCOUNTER — Encounter: Payer: Self-pay | Admitting: Physician Assistant

## 2019-08-22 NOTE — Progress Notes (Signed)
[facility] Department STI clinic/screening visit  Subjective:  Carolyn Rios is a 44 y.o. female being seen today for an STI screening visit. The patient reports they do have symptoms.  Patient reports that they do not desire a pregnancy in the next year.   They reported they are not interested in discussing contraception today.  No LMP recorded (approximate). Patient has had an implant.   Patient has the following medical conditions:   Patient Active Problem List   Diagnosis Date Noted  . Anemia affecting pregnancy 08/19/2014  . Gestational diabetes mellitus in third trimester 08/19/2014  . Multigravida of advanced maternal age in third trimester 08/19/2014  . Supervision of high-risk pregnancy 08/19/2014    Chief Complaint  Patient presents with  . SEXUALLY TRANSMITTED DISEASE    HPI  Patient reports that she has had vaginal burning and dysuria since last Thursday.  Reports that it has progressively worsened and that the symptoms are worse externally than internally.  Denies changes in soap/detergent and other products.  Same partner for 3 years.  Has a Nexplanon for BCM.  See flowsheet for further details and programmatic requirements.    The following portions of the patient's history were reviewed and updated as appropriate: allergies, current medications, past medical history, past social history, past surgical history and problem list.  Objective:  There were no vitals filed for this visit.  Physical Exam Constitutional:      General: She is not in acute distress.    Appearance: Normal appearance. She is normal weight.  HENT:     Head: Normocephalic and atraumatic.     Comments: No nits, lice, or hair loss. No cervical, supraclavicular or axillary adenopathy.     Mouth/Throat:     Mouth: Mucous membranes are moist.     Pharynx: Oropharynx is clear. No oropharyngeal exudate or posterior oropharyngeal erythema.  Eyes:     Conjunctiva/sclera:  Conjunctivae normal.  Pulmonary:     Effort: Pulmonary effort is normal.  Abdominal:     Palpations: Abdomen is soft. There is no mass.     Tenderness: There is no abdominal tenderness. There is no guarding or rebound.  Genitourinary:    General: Normal vulva.     Rectum: Normal.     Comments: External genitalia/pubic area without nits, lice, lesions and inguinal adenopathy. Labia majora bilaterally with edema and erythema, moderate amount of scaling and discharge, on lesions but very sensitive and painful to light touch. Speculum exam not done due to patient discomfort but swabs for GC/Chlamydia and wet mount done. Musculoskeletal:     Cervical back: Neck supple. No tenderness.  Skin:    General: Skin is warm and dry.     Findings: No bruising, erythema, lesion or rash.  Neurological:     Mental Status: She is alert and oriented to person, place, and time.  Psychiatric:        Mood and Affect: Mood normal.        Behavior: Behavior normal.        Thought Content: Thought content normal.        Judgment: Judgment normal.      Assessment and Plan:  Schylar Allard is a 44 y.o. female presenting to the [facility] Department for STI screening  1. Screening for STD (sexually transmitted disease) Patient into clinic with symptoms.  Declines blood work today. Rec condoms with all sex. - WET PREP FOR TRICH, YEAST, CLUE - Chlamydia/Gonorrhea [facility]  2. Candidal vulvovaginitis Will treat for Candidal infection due to exam findings with Clotrimazole 1% vaginal cream 1 app qhs for 7 days and Clotrimazole 1% cream to use BID for 14-21 days . No sex until symptoms have cleared/7 days. - clotrimazole (CLOTRIMAZOLE-7) 1 % vaginal cream; Place 1 Applicatorful vaginally at bedtime for 7 days.  Dispense: 45 g; Refill: 0 - clotrimazole (LOTRIMIN) 1 % external solution; Apply 1 application topically 2 (two) times daily.  Dispense: 30 mL; Refill: 0  3. BV (bacterial  vaginosis) Will treat for BV with Metronidazole 500mg  #14 1 po BID for 7 days with food, no EtOH for 24 hr before and until 72 hr after taking medicine. No sex for 7 days. - metroNIDAZOLE (FLAGYL) 500 MG tablet; Take 1 tablet (500 mg total) by mouth 2 (two) times daily for 7 days.  Dispense: 14 tablet; Refill: 0     No follow-ups on file.  No future appointments.  Jerene Dilling, PA

## 2019-09-09 ENCOUNTER — Encounter: Payer: Self-pay | Admitting: Emergency Medicine

## 2019-09-09 ENCOUNTER — Emergency Department: Payer: HRSA Program

## 2019-09-09 ENCOUNTER — Other Ambulatory Visit: Payer: Self-pay

## 2019-09-09 ENCOUNTER — Inpatient Hospital Stay
Admission: EM | Admit: 2019-09-09 | Discharge: 2019-09-11 | DRG: 177 | Disposition: A | Discharge status: Home / Self Care | Payer: HRSA Program | Attending: Family Medicine | Admitting: Family Medicine

## 2019-09-09 DIAGNOSIS — R0902 Hypoxemia: Secondary | ICD-10-CM

## 2019-09-09 DIAGNOSIS — E1165 Type 2 diabetes mellitus with hyperglycemia: Secondary | ICD-10-CM | POA: Diagnosis present

## 2019-09-09 DIAGNOSIS — R739 Hyperglycemia, unspecified: Secondary | ICD-10-CM

## 2019-09-09 DIAGNOSIS — J9601 Acute respiratory failure with hypoxia: Secondary | ICD-10-CM | POA: Diagnosis present

## 2019-09-09 DIAGNOSIS — U071 COVID-19: Principal | ICD-10-CM | POA: Diagnosis present

## 2019-09-09 DIAGNOSIS — Z8632 Personal history of gestational diabetes: Secondary | ICD-10-CM

## 2019-09-09 DIAGNOSIS — Z821 Family history of blindness and visual loss: Secondary | ICD-10-CM

## 2019-09-09 DIAGNOSIS — K760 Fatty (change of) liver, not elsewhere classified: Secondary | ICD-10-CM | POA: Diagnosis present

## 2019-09-09 DIAGNOSIS — Z833 Family history of diabetes mellitus: Secondary | ICD-10-CM

## 2019-09-09 DIAGNOSIS — J1282 Pneumonia due to coronavirus disease 2019: Secondary | ICD-10-CM | POA: Diagnosis present

## 2019-09-09 DIAGNOSIS — E876 Hypokalemia: Secondary | ICD-10-CM | POA: Diagnosis present

## 2019-09-09 DIAGNOSIS — Z8249 Family history of ischemic heart disease and other diseases of the circulatory system: Secondary | ICD-10-CM

## 2019-09-09 DIAGNOSIS — B952 Enterococcus as the cause of diseases classified elsewhere: Secondary | ICD-10-CM | POA: Diagnosis present

## 2019-09-09 DIAGNOSIS — Z79899 Other long term (current) drug therapy: Secondary | ICD-10-CM

## 2019-09-09 DIAGNOSIS — N390 Urinary tract infection, site not specified: Secondary | ICD-10-CM | POA: Diagnosis present

## 2019-09-09 DIAGNOSIS — E119 Type 2 diabetes mellitus without complications: Secondary | ICD-10-CM

## 2019-09-09 LAB — CBC WITH DIFFERENTIAL/PLATELET
Abs Immature Granulocytes: 0.04 10*3/uL (ref 0.00–0.07)
Basophils Absolute: 0 10*3/uL (ref 0.0–0.1)
Basophils Relative: 0 %
Eosinophils Absolute: 0 10*3/uL (ref 0.0–0.5)
Eosinophils Relative: 0 %
HCT: 45.8 % (ref 36.0–46.0)
Hemoglobin: 15.6 g/dL — ABNORMAL HIGH (ref 12.0–15.0)
Immature Granulocytes: 1 %
Lymphocytes Relative: 41 %
Lymphs Abs: 2.6 10*3/uL (ref 0.7–4.0)
MCH: 31.3 pg (ref 26.0–34.0)
MCHC: 34.1 g/dL (ref 30.0–36.0)
MCV: 91.8 fL (ref 80.0–100.0)
Monocytes Absolute: 0.5 10*3/uL (ref 0.1–1.0)
Monocytes Relative: 8 %
Neutro Abs: 3.2 10*3/uL (ref 1.7–7.7)
Neutrophils Relative %: 50 %
Platelets: 197 10*3/uL (ref 150–400)
RBC: 4.99 MIL/uL (ref 3.87–5.11)
RDW: 12.4 % (ref 11.5–15.5)
WBC: 6.3 10*3/uL (ref 4.0–10.5)
nRBC: 0 % (ref 0.0–0.2)

## 2019-09-09 LAB — RESPIRATORY PANEL BY RT PCR (FLU A&B, COVID)
Influenza A by PCR: NEGATIVE
Influenza B by PCR: NEGATIVE
SARS Coronavirus 2 by RT PCR: POSITIVE — AB

## 2019-09-09 LAB — COMPREHENSIVE METABOLIC PANEL
ALT: 165 U/L — ABNORMAL HIGH (ref 0–44)
AST: 132 U/L — ABNORMAL HIGH (ref 15–41)
Albumin: 3.7 g/dL (ref 3.5–5.0)
Alkaline Phosphatase: 133 U/L — ABNORMAL HIGH (ref 38–126)
Anion gap: 4 — ABNORMAL LOW (ref 5–15)
BUN: 10 mg/dL (ref 6–20)
CO2: 32 mmol/L (ref 22–32)
Calcium: 8.8 mg/dL — ABNORMAL LOW (ref 8.9–10.3)
Chloride: 100 mmol/L (ref 98–111)
Creatinine, Ser: 0.73 mg/dL (ref 0.44–1.00)
GFR calc Af Amer: >60 mL/min (ref >60)
GFR calc non Af Amer: >60 mL/min (ref >60)
Glucose, Bld: 300 mg/dL — ABNORMAL HIGH (ref 70–99)
Potassium: 3.2 mmol/L — ABNORMAL LOW (ref 3.5–5.1)
Sodium: 136 mmol/L (ref 135–145)
Total Bilirubin: 0.6 mg/dL (ref 0.3–1.2)
Total Protein: 7.9 g/dL (ref 6.5–8.1)

## 2019-09-09 LAB — URINALYSIS, ROUTINE W REFLEX MICROSCOPIC
Bacteria, UA: NONE SEEN
Bilirubin Urine: NEGATIVE
Glucose, UA: >=500 mg/dL — AB
Hgb urine dipstick: NEGATIVE
Ketones, ur: 5 mg/dL — AB
Nitrite: NEGATIVE
Protein, ur: 100 mg/dL — AB
Specific Gravity, Urine: 1.03 (ref 1.005–1.030)
pH: 5 (ref 5.0–8.0)

## 2019-09-09 LAB — PROCALCITONIN: Procalcitonin: <0.1 ng/mL

## 2019-09-09 LAB — TROPONIN I (HIGH SENSITIVITY): Troponin I (High Sensitivity): 3 ng/L

## 2019-09-09 MED ORDER — OXYCODONE HCL 5 MG PO TABS
5.0000 mg | ORAL_TABLET | Freq: Once | ORAL | Status: AC
  Administered 2019-09-09: 23:00:00 5 mg via ORAL
  Filled 2019-09-09: qty 1

## 2019-09-09 MED ORDER — ACETAMINOPHEN 500 MG PO TABS
1000.0000 mg | ORAL_TABLET | Freq: Once | ORAL | Status: AC
  Administered 2019-09-09: 19:00:00 1000 mg via ORAL
  Filled 2019-09-09: qty 2

## 2019-09-09 MED ORDER — ONDANSETRON HCL 4 MG/2ML IJ SOLN
4.0000 mg | Freq: Once | INTRAMUSCULAR | Status: AC
  Administered 2019-09-09: 19:00:00 4 mg via INTRAVENOUS
  Filled 2019-09-09: qty 2

## 2019-09-09 MED ORDER — SODIUM CHLORIDE 0.9 % IV SOLN
1.0000 g | Freq: Once | INTRAVENOUS | Status: AC
  Administered 2019-09-09: 23:00:00 1 g via INTRAVENOUS
  Filled 2019-09-09: qty 10

## 2019-09-09 MED ORDER — SODIUM CHLORIDE 0.9 % IV BOLUS
1000.0000 mL | Freq: Once | INTRAVENOUS | Status: AC
  Administered 2019-09-09: 19:00:00 1000 mL via INTRAVENOUS

## 2019-09-09 MED ORDER — KETOROLAC TROMETHAMINE 30 MG/ML IJ SOLN
15.0000 mg | Freq: Once | INTRAMUSCULAR | Status: AC
  Administered 2019-09-09: 19:00:00 15 mg via INTRAVENOUS
  Filled 2019-09-09: qty 1

## 2019-09-09 MED ORDER — LIDOCAINE 5 % EX PTCH
1.0000 | MEDICATED_PATCH | CUTANEOUS | Status: DC
  Administered 2019-09-09 – 2019-09-10 (×2): 1 via TRANSDERMAL
  Filled 2019-09-09 (×3): qty 1

## 2019-09-09 MED ORDER — ONDANSETRON 4 MG PO TBDP: 4.0000 mg | ORAL_TABLET | Freq: Three times a day (TID) | ORAL | 0 refills | Status: DC | PRN

## 2019-09-09 MED ORDER — CEPHALEXIN 250 MG PO CAPS: 250.0000 mg | ORAL_CAPSULE | Freq: Four times a day (QID) | ORAL | 0 refills | Status: DC

## 2019-09-09 MED ORDER — POTASSIUM CHLORIDE CRYS ER 20 MEQ PO TBCR
40.0000 meq | EXTENDED_RELEASE_TABLET | Freq: Once | ORAL | Status: AC
  Administered 2019-09-09: 19:00:00 40 meq via ORAL
  Filled 2019-09-09: qty 2

## 2019-09-09 NOTE — ED Provider Notes (Signed)
[facility] Emergency Department Provider Note  ____________________________________________   None    (approximate)  I have reviewed the triage vital signs and the nursing notes.   HISTORY  Chief Complaint Nausea, Cough, and Generalized Body Aches    HPI Carolyn Rios is a 44 y.o. female who is otherwise healthy who comes in with multiple symptoms.  Patient states for the past 1 week she has had multiple symptoms including generalized weakness, bone aches, nausea, coughing, shortness of breath, chest discomfort with coughing.  Denies a history of blood clots.  Does not know of anyone who is been coronavirus positive.  Her symptoms have been constant, 1 week, not better with over-the-counter Tylenol, nothing seems to make it worse.  Denies any family members being sick as well.          Past Medical History:  Diagnosis Date  . Abscess of right breast 01/08/2019  . Left breast abscess   . No known health problems 04/04/2018    Patient Active Problem List   Diagnosis Date Noted  . Anemia affecting pregnancy 08/19/2014  . Gestational diabetes mellitus in third trimester 08/19/2014  . Multigravida of advanced maternal age in third trimester 08/19/2014  . Supervision of high-risk pregnancy 08/19/2014    Past Surgical History:  Procedure Laterality Date  . BREAST CYST ASPIRATION Left 03/2018   abscess drainage  . BREAST EXCISIONAL BIOPSY Left 06/2018   Dr Dahlia Byes surgical removed abscess  . INCISION AND DRAINAGE ABSCESS Left 06/28/2018   Procedure: INCISION AND DRAINAGE LEFT BREAST ABSCESS;  Surgeon: Jules Husbands, MD;  Location: [facility] ORS;  Service: General;  Laterality: Left;  . INCISION AND DRAINAGE ABSCESS Right 01/09/2019   Procedure: INCISION AND DRAINAGE ABSCESS Breast;  Surgeon: Vickie Epley, MD;  Location: [facility] ORS;  Service: General;  Laterality: Right;  . NO PAST SURGERIES      Prior to Admission medications   Medication  Sig Start Date End Date Taking? Authorizing Provider  clotrimazole (LOTRIMIN) 1 % external solution Apply 1 application topically 2 (two) times daily. 08/21/19   Jerene Dilling, PA  oxyCODONE (OXY IR/ROXICODONE) 5 MG immediate release tablet Take 1 tablet (5 mg total) by mouth every 6 (six) hours as needed for severe pain. 01/10/19   Tylene Fantasia, PA-C    Allergies Patient has no known allergies.  Family History  Problem Relation Age of Onset  . Blindness Mother   . Diabetes Mother   . Diabetes Father   . Hypertension Sister   . Cancer Neg Hx     Social History Social History   Tobacco Use  . Smoking status: Never Smoker  . Smokeless tobacco: Never Used  Substance Use Topics  . Alcohol use: Never  . Drug use: Never      Review of Systems Constitutional: No fever/chills Eyes: No visual changes. ENT: No sore throat. Cardiovascular: Denies chest pain. Respiratory: Denies shortness of breath. Gastrointestinal: No abdominal pain.  No nausea, no vomiting.  No diarrhea.  No constipation. Genitourinary: Negative for dysuria. Musculoskeletal: Negative for back pain. Skin: Negative for rash. Neurological: Negative for headaches, focal weakness or numbness. All other ROS negative ____________________________________________   PHYSICAL EXAM:  VITAL SIGNS: ED Triage Vitals  Enc Vitals Group     BP 09/09/19 1752 127/88     Pulse Rate 09/09/19 1752 (!) 104     Resp 09/09/19 1752 (!) 22     Temp 09/09/19 1752 99.4 F (37.4 C)  Temp Source 09/09/19 1752 Oral     SpO2 09/09/19 1752 91 %     Weight 09/09/19 1751 130 lb (59 kg)     Height 09/09/19 1751 4\' 10"  (1.473 m)     Head Circumference --      Peak Flow --      Pain Score 09/09/19 1750 7     Pain Loc --      Pain Edu? --      Excl. in GC? --     Constitutional: Alert and oriented. Well appearing and in no acute distress. Eyes: Conjunctivae are normal. EOMI. Head: Atraumatic. Nose: No  congestion/rhinnorhea. Mouth/Throat: Mucous membranes are moist.   Neck: No stridor. Trachea Midline. FROM Cardiovascular: Normal rate, regular rhythm. Grossly normal heart sounds.  Good peripheral circulation. Respiratory: Normal respiratory effort.  No retractions. Gastrointestinal: Soft and nontender. No distention. No abdominal bruits.  Musculoskeletal: No lower extremity tenderness nor edema.  No joint effusions. Neurologic:  Normal speech and language. No gross focal neurologic deficits are appreciated.  Skin:  Skin is warm, dry and intact. No rash noted. Psychiatric: Mood and affect are normal. Speech and behavior are normal. GU: Deferred   ____________________________________________   LABS (all labs ordered are listed, but only abnormal results are displayed)  Labs Reviewed  RESPIRATORY PANEL BY RT PCR (FLU A&B, COVID) - Abnormal; Notable for the following components:      Result Value   SARS Coronavirus 2 by RT PCR POSITIVE (*)    All other components within normal limits  CBC WITH DIFFERENTIAL/PLATELET - Abnormal; Notable for the following components:   Hemoglobin 15.6 (*)    All other components within normal limits  COMPREHENSIVE METABOLIC PANEL - Abnormal; Notable for the following components:   Potassium 3.2 (*)    Glucose, Bld 300 (*)    Calcium 8.8 (*)    AST 132 (*)    ALT 165 (*)    Alkaline Phosphatase 133 (*)    Anion gap 4 (*)    All other components within normal limits  PROCALCITONIN  PROCALCITONIN  URINALYSIS, ROUTINE W REFLEX MICROSCOPIC  POC URINE PREG, ED  TROPONIN I (HIGH SENSITIVITY)   ____________________________________________   ED ECG REPORT I, 09/11/19, the attending physician, personally viewed and interpreted this ECG. EKG is normal sinus rate of 81, no ST elevation, no T wave inversion, normal intervals  ____________________________________________  RADIOLOGY Concha Se, personally viewed and evaluated these images  (plain radiographs) as part of my medical decision making, as well as reviewing the written report by the radiologist.  ED MD interpretation: Bilateral lung opacities consistent with COVID-19    Official radiology report(s): DG Chest 2 View  Result Date: 09/09/2019 CLINICAL DATA:  Cough, nausea, generalized body aches, and malaise for several days. EXAM: CHEST - 2 VIEW COMPARISON:  None. FINDINGS: The cardiomediastinal silhouette is within normal limits. The lungs are hypoinflated. There are ill-defined patchy airspace opacities in the left greater than right mid and lower lungs. No pleural effusion or pneumothorax is identified. Right upper quadrant abdominal surgical clips are noted. No acute osseous abnormality is seen. IMPRESSION: Patchy bilateral lung opacities concerning for pneumonia including atypical/viral etiologies. Electronically Signed   By: 09/11/2019 M.D.   On: 09/09/2019 18:59    ____________________________________________   PROCEDURES  Procedure(s) performed (including Critical Care):  Procedures   ____________________________________________   INITIAL IMPRESSION / ASSESSMENT AND PLAN / ED COURSE  Manasvini Whatley was evaluated in  Emergency Department on 09/09/2019 for the symptoms described in the history of present illness. She was evaluated in the context of the global COVID-19 pandemic, which necessitated consideration that the patient might be at risk for infection with the SARS-CoV-2 virus that causes COVID-19. Institutional protocols and algorithms that pertain to the evaluation of patients at risk for COVID-19 are in a state of rapid change based on information released by regulatory bodies including the CDC and federal and state organizations. These policies and algorithms were followed during the patient's care in the ED.    Given patient's multiple symptoms this is most concerning for coronavirus.  Will get testing.  Will get chest x-ray to evaluate  for pneumonia.  Will get labs to evaluate for electrolyte abnormalities, AKI.  No abdominal tenderness at this time.  Labs are notable for a elevated glucose but no anion gap elevation.  Potassium slightly low at 3.2 will give some oral repletion.  No evidence of anemia.  Liver function tests are slightly elevated with a normal T bili.  Patient has no right upper quadrant tenderness.   Patient was Covid positive.  Chest x-ray consistent with Covid.  Procalcitonin is negative so therefore does not need antibiotics.  Attempted to ambulate patient.  Patient is satting 95% during my conversation and desats down to 92%.  Patient does not feel very short of breath with ambulation.  However she was having some right flank pain.  Will get urine and do CT renal to make sure is no evidence of kidney stone.  Discussed with patient that this is reassuring patient feels comfortable with being discharged home.  We discussed return precautions in regards to shortness of breath.  Patient had off to oncoming team pending CT scan and UA.       ____________________________________________   FINAL CLINICAL IMPRESSION(S) / ED DIAGNOSES   Final diagnoses:  COVID-19      MEDICATIONS GIVEN DURING THIS VISIT:  Medications  lidocaine (LIDODERM) 5 % 1 patch (has no administration in time range)  oxyCODONE (Oxy IR/ROXICODONE) immediate release tablet 5 mg (has no administration in time range)  sodium chloride 0.9 % bolus 1,000 mL (0 mLs Intravenous Stopped 09/09/19 2202)  ondansetron (ZOFRAN) injection 4 mg (4 mg Intravenous Given 09/09/19 1904)  acetaminophen (TYLENOL) tablet 1,000 mg (1,000 mg Oral Given 09/09/19 1908)  ketorolac (TORADOL) 30 MG/ML injection 15 mg (15 mg Intravenous Given 09/09/19 1904)  potassium chloride SA (KLOR-CON) CR tablet 40 mEq (40 mEq Oral Given 09/09/19 1909)     ED Discharge Orders         Ordered    ondansetron (ZOFRAN ODT) 4 MG disintegrating tablet  Every 8 hours PRN      09/09/19 2249           Note:  This document was prepared using Dragon voice recognition software and may include unintentional dictation errors.   Concha Se, MD 09/09/19 2250

## 2019-09-09 NOTE — ED Triage Notes (Signed)
Pt presents to ED via POV with c/o cough, nausea, generalized body aches and general malaise x several days. Pt states non-productive cough, also c/o chills at this time.

## 2019-09-09 NOTE — ED Notes (Signed)
Ambulated pt per MD request. Informed MD of pt's oxygen levels and intense back pain. MD states will talk to pt.

## 2019-09-10 ENCOUNTER — Emergency Department: Payer: HRSA Program

## 2019-09-10 ENCOUNTER — Encounter: Payer: Self-pay | Admitting: Internal Medicine

## 2019-09-10 ENCOUNTER — Other Ambulatory Visit: Payer: Self-pay

## 2019-09-10 DIAGNOSIS — E1165 Type 2 diabetes mellitus with hyperglycemia: Secondary | ICD-10-CM

## 2019-09-10 DIAGNOSIS — J1282 Pneumonia due to coronavirus disease 2019: Secondary | ICD-10-CM

## 2019-09-10 DIAGNOSIS — N390 Urinary tract infection, site not specified: Secondary | ICD-10-CM

## 2019-09-10 DIAGNOSIS — R739 Hyperglycemia, unspecified: Secondary | ICD-10-CM

## 2019-09-10 DIAGNOSIS — U071 COVID-19: Secondary | ICD-10-CM | POA: Diagnosis not present

## 2019-09-10 DIAGNOSIS — R0902 Hypoxemia: Secondary | ICD-10-CM

## 2019-09-10 LAB — CREATININE, SERUM
Creatinine, Ser: 0.52 mg/dL (ref 0.44–1.00)
GFR calc Af Amer: >60 mL/min (ref >60)
GFR calc non Af Amer: >60 mL/min (ref >60)

## 2019-09-10 LAB — PROCALCITONIN: Procalcitonin: 0.1 ng/mL

## 2019-09-10 LAB — CBC
HCT: 44 % (ref 36.0–46.0)
Hemoglobin: 14.4 g/dL (ref 12.0–15.0)
MCH: 30.9 pg (ref 26.0–34.0)
MCHC: 32.7 g/dL (ref 30.0–36.0)
MCV: 94.4 fL (ref 80.0–100.0)
Platelets: 173 10*3/uL (ref 150–400)
RBC: 4.66 MIL/uL (ref 3.87–5.11)
RDW: 12.8 % (ref 11.5–15.5)
WBC: 4.9 10*3/uL (ref 4.0–10.5)
nRBC: 0 % (ref 0.0–0.2)

## 2019-09-10 LAB — GLUCOSE, CAPILLARY
Glucose-Capillary: 179 mg/dL — ABNORMAL HIGH (ref 70–99)
Glucose-Capillary: 200 mg/dL — ABNORMAL HIGH (ref 70–99)
Glucose-Capillary: 278 mg/dL — ABNORMAL HIGH (ref 70–99)
Glucose-Capillary: 270 mg/dL — ABNORMAL HIGH (ref 70–99)
Glucose-Capillary: 194 mg/dL — ABNORMAL HIGH (ref 70–99)

## 2019-09-10 LAB — HIV ANTIBODY (ROUTINE TESTING W REFLEX): HIV Screen 4th Generation wRfx: NONREACTIVE

## 2019-09-10 LAB — HEMOGLOBIN A1C
Hgb A1c MFr Bld: 11.4 % — ABNORMAL HIGH (ref 4.8–5.6)
Mean Plasma Glucose: 280.48 mg/dL

## 2019-09-10 LAB — POCT PREGNANCY, URINE: Preg Test, Ur: NEGATIVE

## 2019-09-10 LAB — ABO/RH: ABO/RH(D): O POS

## 2019-09-10 MED ORDER — ADULT MULTIVITAMIN W/MINERALS CH
1.0000 | ORAL_TABLET | Freq: Every day | ORAL | Status: DC
  Administered 2019-09-10 – 2019-09-11 (×2): 1 via ORAL
  Filled 2019-09-10 (×2): qty 1

## 2019-09-10 MED ORDER — ZINC SULFATE 220 (50 ZN) MG PO CAPS
220.0000 mg | ORAL_CAPSULE | Freq: Every day | ORAL | Status: DC
  Administered 2019-09-10 – 2019-09-11 (×2): 220 mg via ORAL
  Filled 2019-09-10 (×2): qty 1

## 2019-09-10 MED ORDER — GUAIFENESIN-DM 100-10 MG/5ML PO SYRP
10.0000 mL | ORAL_SOLUTION | ORAL | Status: DC | PRN
  Filled 2019-09-10: qty 10

## 2019-09-10 MED ORDER — INSULIN DETEMIR 100 UNIT/ML ~~LOC~~ SOLN
9.0000 [IU] | Freq: Every day | SUBCUTANEOUS | Status: DC
  Administered 2019-09-10 – 2019-09-11 (×2): 9 [IU] via SUBCUTANEOUS
  Filled 2019-09-10 (×2): qty 1

## 2019-09-10 MED ORDER — SODIUM CHLORIDE 0.9 % IV SOLN
1.0000 g | INTRAVENOUS | Status: DC
  Filled 2019-09-10: qty 10

## 2019-09-10 MED ORDER — LIVING WELL WITH DIABETES BOOK - IN SPANISH
Freq: Once | Status: AC
  Filled 2019-09-10 (×3): qty 1

## 2019-09-10 MED ORDER — ASCORBIC ACID 500 MG PO TABS
500.0000 mg | ORAL_TABLET | Freq: Every day | ORAL | Status: DC
  Administered 2019-09-10 – 2019-09-11 (×2): 500 mg via ORAL
  Filled 2019-09-10 (×2): qty 1

## 2019-09-10 MED ORDER — DEXAMETHASONE 4 MG PO TABS
6.0000 mg | ORAL_TABLET | ORAL | Status: DC
  Administered 2019-09-10 – 2019-09-11 (×2): 6 mg via ORAL
  Filled 2019-09-10: qty 2
  Filled 2019-09-10: qty 1.5
  Filled 2019-09-10: qty 2

## 2019-09-10 MED ORDER — SODIUM CHLORIDE 0.9 % IV SOLN
100.0000 mg | Freq: Every day | INTRAVENOUS | Status: DC
  Administered 2019-09-11: 100 mg via INTRAVENOUS
  Filled 2019-09-10: qty 20

## 2019-09-10 MED ORDER — SODIUM CHLORIDE 0.9 % IV SOLN
200.0000 mg | Freq: Once | INTRAVENOUS | Status: AC
  Administered 2019-09-10: 02:00:00 200 mg via INTRAVENOUS
  Filled 2019-09-10: qty 200

## 2019-09-10 MED ORDER — INSULIN ASPART 100 UNIT/ML ~~LOC~~ SOLN
0.0000 [IU] | SUBCUTANEOUS | Status: DC
  Administered 2019-09-10: 7 [IU] via SUBCUTANEOUS
  Administered 2019-09-10: 4 [IU] via SUBCUTANEOUS
  Administered 2019-09-10: 17:00:00 11 [IU] via SUBCUTANEOUS
  Administered 2019-09-10: 09:00:00 4 [IU] via SUBCUTANEOUS
  Administered 2019-09-11: 12:00:00 11 [IU] via SUBCUTANEOUS
  Administered 2019-09-11 (×3): 7 [IU] via SUBCUTANEOUS
  Filled 2019-09-10 (×10): qty 1

## 2019-09-10 MED ORDER — LINAGLIPTIN 5 MG PO TABS
5.0000 mg | ORAL_TABLET | Freq: Every day | ORAL | Status: DC
  Administered 2019-09-10 – 2019-09-11 (×2): 5 mg via ORAL
  Filled 2019-09-10 (×2): qty 1

## 2019-09-10 MED ORDER — INSULIN STARTER KIT- SYRINGES (SPANISH)
1.0000 | Freq: Once | Status: AC
  Administered 2019-09-10: 18:00:00 1
  Filled 2019-09-10 (×3): qty 1

## 2019-09-10 MED ORDER — ALBUTEROL SULFATE HFA 108 (90 BASE) MCG/ACT IN AERS
2.0000 | INHALATION_SPRAY | Freq: Four times a day (QID) | RESPIRATORY_TRACT | Status: DC
  Administered 2019-09-10 – 2019-09-11 (×7): 2 via RESPIRATORY_TRACT
  Filled 2019-09-10: qty 6.7

## 2019-09-10 MED ORDER — ENOXAPARIN SODIUM 40 MG/0.4ML ~~LOC~~ SOLN
40.0000 mg | SUBCUTANEOUS | Status: DC
  Administered 2019-09-10 – 2019-09-11 (×2): 40 mg via SUBCUTANEOUS
  Filled 2019-09-10 (×2): qty 0.4

## 2019-09-10 MED ORDER — INSULIN DETEMIR 100 UNIT/ML ~~LOC~~ SOLN: 0.1500 [IU]/kg | Freq: Two times a day (BID) | SUBCUTANEOUS | Status: DC

## 2019-09-10 NOTE — ED Provider Notes (Addendum)
I assumed care of the patient from Dr. Fuller Plan at 11:00 PM with recommendation of follow-up on CT renal which was negative for acute intra-abdominal or pelvic pathology however did reveal groundglass opacities in the lower lobe consistent with diagnosis of COVID-19 here in the emergency department today.  I went to evaluate the patient and noted oxygen saturation of 90% on room air.  Review of the patient's laboratory data revealed elevated glucose of 300 with no previous history of diabetes per the patient.  Given patient's hypoxia at rest in the setting of COVID-19 patient was admitted to the hospitalist for further evaluation and management.  Remdesivir consult placed to pharmacy.   Darci Current, MD 09/10/19 0201    Darci Current, MD 09/10/19 0201

## 2019-09-10 NOTE — H&P (Signed)
History and Physical    Carolyn Rios PIR:[id number] DOB: 18-Aug-1975 DOA: 09/09/2019  PCP: Tessa Lerner, NP   Patient coming from: home I have personally briefly reviewed patient's old medical records in [facility]  Chief Complaint: Sob, chest pain, cough, vomitng  HPI: Carolyn Rios is a 44 y.o. female with medical history significant for gestational diabetes who presents to the emergency room with a 1 week complaint of cough and shortness of breath, now with pleuritic chest pain on coughing.  She said she vomited for a couple of days but this has since resolved.  She denies abdominal pain or change in bowel habits.  Denies fever.  ED Course: On arrival in the emergency room she was afebrile at 98.2 with respirations of 25 with oxygen saturation on 91% on room air improving to 100% on O2 at 2 L.  Blood sugar was 300.  Urinalysis was consistent with UTI.  She had a CT abdomen and pelvis in the emergency room that showed glass and linear airspace opacities in both lower lobes could reflect atelectasis or pneumonia.  Covid test was positive. Review of Systems: As per HPI otherwise 10 point review of systems negative.    Past Medical History:  Diagnosis Date  . Abscess of right breast 01/08/2019  . Left breast abscess   . No known health problems 04/04/2018    Past Surgical History:  Procedure Laterality Date  . BREAST CYST ASPIRATION Left 03/2018   abscess drainage  . BREAST EXCISIONAL BIOPSY Left 06/2018   Dr Everlene Farrier surgical removed abscess  . INCISION AND DRAINAGE ABSCESS Left 06/28/2018   Procedure: INCISION AND DRAINAGE LEFT BREAST ABSCESS;  Surgeon: Leafy Ro, MD;  Location: [facility] ORS;  Service: General;  Laterality: Left;  . INCISION AND DRAINAGE ABSCESS Right 01/09/2019   Procedure: INCISION AND DRAINAGE ABSCESS Breast;  Surgeon: Ancil Linsey, MD;  Location: [facility] ORS;  Service: General;  Laterality: Right;  . NO PAST SURGERIES       reports that she has never smoked. She has never used smokeless tobacco. She reports that she does not drink alcohol or use drugs.  No Known Allergies  Family History  Problem Relation Age of Onset  . Blindness Mother   . Diabetes Mother   . Diabetes Father   . Hypertension Sister   . Cancer Neg Hx      Prior to Admission medications   Medication Sig Start Date End Date Taking? Authorizing Provider  cephALEXin (KEFLEX) 250 MG capsule Take 1 capsule (250 mg total) by mouth 4 (four) times daily for 10 days. 09/09/19 09/19/19  Concha Se, MD  clotrimazole (LOTRIMIN) 1 % external solution Apply 1 application topically 2 (two) times daily. Patient not taking: Reported on 09/10/2019 08/21/19   Matt Holmes, PA  ondansetron (ZOFRAN ODT) 4 MG disintegrating tablet Take 1 tablet (4 mg total) by mouth every 8 (eight) hours as needed for nausea or vomiting. 09/09/19   Concha Se, MD  oxyCODONE (OXY IR/ROXICODONE) 5 MG immediate release tablet Take 1 tablet (5 mg total) by mouth every 6 (six) hours as needed for severe pain. Patient not taking: Reported on 09/10/2019 01/10/19   Donovan Kail, PA-C    Physical Exam: Vitals:   09/09/19 2300 09/10/19 0000 09/10/19 0214 09/10/19 0330  BP: 107/75 110/83 104/75 100/73  Pulse: 74 87 71 70  Resp: 14 16 (!) 25 10  Temp:   98.2 F (36.8 C)  TempSrc:   Oral   SpO2: 93% 96% 100% 99%  Weight:      Height:         Vitals:   09/09/19 2300 09/10/19 0000 09/10/19 0214 09/10/19 0330  BP: 107/75 110/83 104/75 100/73  Pulse: 74 87 71 70  Resp: 14 16 (!) 25 10  Temp:   98.2 F (36.8 C)   TempSrc:   Oral   SpO2: 93% 96% 100% 99%  Weight:      Height:        Constitutional: NAD, alert and oriented x 3 Eyes: PERRL, lids and conjunctivae normal ENMT: Mucous membranes are moist.  Neck: normal, supple, no masses, no thyromegaly Respiratory: Decreased bilaterally, with few crackles.  Mild tachypnea with mildly increased respiratory effort. No  accessory muscle use.  Cardiovascular: Regular rate and rhythm, no murmurs / rubs / gallops. No extremity edema. 2+ pedal pulses. No carotid bruits.  Abdomen: no tenderness, no masses palpated. No hepatosplenomegaly. Bowel sounds positive.  Musculoskeletal: no clubbing / cyanosis. No joint deformity upper and lower extremities.  Skin: no rashes, lesions, ulcers.  Neurologic: No gross focal neurologic deficit. Psychiatric: Normal mood and affect.   Labs on Admission: I have personally reviewed following labs and imaging studies  CBC: Recent Labs  Lab 09/09/19 1800  WBC 6.3  NEUTROABS 3.2  HGB 15.6*  HCT 45.8  MCV 91.8  PLT 983   Basic Metabolic Panel: Recent Labs  Lab 09/09/19 1800  NA 136  K 3.2*  CL 100  CO2 32  GLUCOSE 300*  BUN 10  CREATININE 0.73  CALCIUM 8.8*   GFR: Estimated Creatinine Clearance: 69.6 mL/min (by C-G formula based on SCr of 0.73 mg/dL). Liver Function Tests: Recent Labs  Lab 09/09/19 1800  AST 132*  ALT 165*  ALKPHOS 133*  BILITOT 0.6  PROT 7.9  ALBUMIN 3.7   No results for input(s): LIPASE, AMYLASE in the last 168 hours. No results for input(s): AMMONIA in the last 168 hours. Coagulation Profile: No results for input(s): INR, PROTIME in the last 168 hours. Cardiac Enzymes: No results for input(s): CKTOTAL, CKMB, CKMBINDEX, TROPONINI in the last 168 hours. BNP (last 3 results) No results for input(s): PROBNP in the last 8760 hours. HbA1C: No results for input(s): HGBA1C in the last 72 hours. CBG: No results for input(s): GLUCAP in the last 168 hours. Lipid Profile: No results for input(s): CHOL, HDL, LDLCALC, TRIG, CHOLHDL, LDLDIRECT in the last 72 hours. Thyroid Function Tests: No results for input(s): TSH, T4TOTAL, FREET4, T3FREE, THYROIDAB in the last 72 hours. Anemia Panel: No results for input(s): VITAMINB12, FOLATE, FERRITIN, TIBC, IRON, RETICCTPCT in the last 72 hours. Urine analysis:    Component Value Date/Time    COLORURINE YELLOW (A) 09/09/2019 2239   APPEARANCEUR CLOUDY (A) 09/09/2019 2239   LABSPEC 1.030 09/09/2019 2239   PHURINE 5.0 09/09/2019 2239   GLUCOSEU >=500 (A) 09/09/2019 2239   HGBUR NEGATIVE 09/09/2019 2239   BILIRUBINUR NEGATIVE 09/09/2019 2239   KETONESUR 5 (A) 09/09/2019 2239   PROTEINUR 100 (A) 09/09/2019 2239   NITRITE NEGATIVE 09/09/2019 2239   LEUKOCYTESUR MODERATE (A) 09/09/2019 2239    Radiological Exams on Admission: DG Chest 2 View  Result Date: 09/09/2019 CLINICAL DATA:  Cough, nausea, generalized body aches, and malaise for several days. EXAM: CHEST - 2 VIEW COMPARISON:  None. FINDINGS: The cardiomediastinal silhouette is within normal limits. The lungs are hypoinflated. There are ill-defined patchy airspace opacities in the left greater than right  mid and lower lungs. No pleural effusion or pneumothorax is identified. Right upper quadrant abdominal surgical clips are noted. No acute osseous abnormality is seen. IMPRESSION: Patchy bilateral lung opacities concerning for pneumonia including atypical/viral etiologies. Electronically Signed   By: Sebastian Ache M.D.   On: 09/09/2019 18:59   CT Renal Stone Study  Result Date: 09/10/2019 CLINICAL DATA:  COVID positive.  Cough, nausea, body aches EXAM: CT ABDOMEN AND PELVIS WITHOUT CONTRAST TECHNIQUE: Multidetector CT imaging of the abdomen and pelvis was performed following the standard protocol without IV contrast. COMPARISON:  None. FINDINGS: Lower chest: Ground-glass airspace opacities in the lower lobes bilaterally. No effusions. Heart is normal size. Hepatobiliary: Prior cholecystectomy. Diffuse fatty infiltration of the liver. No focal hepatic abnormality. Pancreas: No focal abnormality or ductal dilatation. Spleen: No focal abnormality.  Normal size. Adrenals/Urinary Tract: No adrenal abnormality. No focal renal abnormality. No stones or hydronephrosis. Urinary bladder is unremarkable. Stomach/Bowel: Stomach, large and small  bowel grossly unremarkable. Normal appendix. Vascular/Lymphatic: No evidence of aneurysm or adenopathy. Reproductive: Uterus and adnexa unremarkable.  No mass. Other: No free fluid or free air. Musculoskeletal: No acute bony abnormality. IMPRESSION: Diffuse fatty infiltration of the liver. No acute findings in the abdomen or pelvis. Ground-glass and linear airspace opacities in both lower lobes could reflect atelectasis or pneumonia. Electronically Signed   By: Charlett Nose M.D.   On: 09/10/2019 01:06    EKG: Independently reviewed.   Assessment/Plan Active Problems:   Pneumonia due to COVID-19 virus   Hypoxia -IV remdesivir, IV dexamethasone, albuterol, antitussives and vitamins per Covid protocol. Supplemental oxygen to keep sats over 92% Proning as tolerated    UTI (urinary tract infection) -IV Rocephin -Follow cultures    Hyperglycemia due to type 2 diabetes mellitus (HCC),new onset Blood sugar was 300 in the emergency room No prior history of type 2 diabetes but does have gestational diabetes Insulin sliding scale per COVID-19 protocol Hemoglobin A1c to assess baseline control Diabetic coordinator consult -   DVT prophylaxis: lovenox Code Status: full code  Family Communication: none  Disposition Plan: Back to previous home environment Consults called: none     Andris Baumann MD [facility]     09/10/2019, 3:53 AM

## 2019-09-10 NOTE — Progress Notes (Signed)
Remdesivir - Pharmacy Brief Note   O:  CXR: IMPRESSION: Patchy bilateral lung opacities concerning for pneumonia including atypical/viral etiologies. SpO2: 97% on 2L [place]   A/P:  Remdesivir 200 mg IVPB once followed by 100 mg IVPB daily x 4 days.   Thomasene Ripple, PharmD, BCPS Clinical Pharmacist 09/10/2019 1:55 AM

## 2019-09-10 NOTE — Progress Notes (Signed)
Interpreter service/agent  Insurance underwriter) used for admission and medication  education

## 2019-09-10 NOTE — ED Notes (Signed)
Ambulated pt per MD request.  Pulse ox reading 95-96% with ambulation.

## 2019-09-10 NOTE — Progress Notes (Signed)
PROGRESS NOTE    Carolyn Rios  XLK:[id number] DOB: [birth year] DOA: 09/09/2019 PCP: Tessa Lerner, NP      Brief Narrative:  Mrs. Carolyn Rios is a 44 y.o. F with hx GDM who presented with 1 week cough, shortness of breath, no pleuritic chest pain and malaise.  In the ER, respirations 25, O2 sat 91% on room air, glucose 300.  CT of the abdomen and pelvis showed groundglass opacities in both lower lobes, Covid positive.       Assessment & Plan:  COVID-19 pneumonitis with acute hypoxic respiratory failure Presented with shortness of breath, respiratory rate 25, oxygen saturation less than 90%. -Continue remdesivir, day 1 of 5 -Continue dexamethasone, day 1 -Continue zinc and vitamin C   Diabetes Hemoglobin A1c 11%.  Not currently on treatment, has history of gestational diabetes. -Continue Levemir -Start linagliptin -Continue sliding scale corrections, titrate insulins as needed   Hypokalemia -Supplement potassium  Pyuria Asymptomatic, hold antibiotics   Disposition: The patient was admitted with COVID-19.   I will discharge when she is stable off oxygen, able to ambulate without dyspnea, and decisions regarding ongoing remdesivir have been made.  If she remains stable off oxygen for the next 24 hours, and is asymptomatic with ambulation, will consider getting her home tomorrow without continue remdesivir versus discharge with outpatient remdesivir       MDM: This is a no charge note.  For further details, please see H&P by my partner Dr. Para March from earlier today.  The below labs and imaging reports were reviewed and summarized above.    DVT prophylaxis: Lovenox Code Status: Full code Family Communication:     Consultants:     Procedures:   1/25 chest x-ray multifocal pneumonia  1/26 CT renal study fatty liver, pneumonia, no hydronephrosis  Antimicrobials:   Ceftriaxone x1  Culture data:   1/25 urine culture  pending          Subjective: Patient has severe cough, some pleuritic pain.  No confusion, fever.        Objective: Vitals:   09/10/19 0330 09/10/19 0400 09/10/19 0500 09/10/19 0805  BP: 100/73 109/79 122/83 130/82  Pulse: 70 71 77 88  Resp: 10 19 12 16   Temp:  97.9 F (36.6 C)  98.2 F (36.8 C)  TempSrc:  Oral  Oral  SpO2: 99% 98% 100% 100%  Weight:      Height:        Intake/Output Summary (Last 24 hours) at 09/10/2019 1343 Last data filed at 09/10/2019 0357 Gross per 24 hour  Intake 1250 ml  Output --  Net 1250 ml   Filed Weights   09/09/19 1751  Weight: 59 kg    Examination: The patient was seen and examined.      Data Reviewed: I have personally reviewed following labs and imaging studies:  CBC: Recent Labs  Lab 09/09/19 1800 09/10/19 0610  WBC 6.3 4.9  NEUTROABS 3.2  --   HGB 15.6* 14.4  HCT 45.8 44.0  MCV 91.8 94.4  PLT 197 173   Basic Metabolic Panel: Recent Labs  Lab 09/09/19 1800 09/10/19 0610  NA 136  --   K 3.2*  --   CL 100  --   CO2 32  --   GLUCOSE 300*  --   BUN 10  --   CREATININE 0.73 0.52  CALCIUM 8.8*  --    GFR: Estimated Creatinine Clearance: 69.6 mL/min (by C-G formula based on SCr of 0.52 mg/dL).  Liver Function Tests: Recent Labs  Lab 09/09/19 1800  AST 132*  ALT 165*  ALKPHOS 133*  BILITOT 0.6  PROT 7.9  ALBUMIN 3.7   No results for input(s): LIPASE, AMYLASE in the last 168 hours. No results for input(s): AMMONIA in the last 168 hours. Coagulation Profile: No results for input(s): INR, PROTIME in the last 168 hours. Cardiac Enzymes: No results for input(s): CKTOTAL, CKMB, CKMBINDEX, TROPONINI in the last 168 hours. BNP (last 3 results) No results for input(s): PROBNP in the last 8760 hours. HbA1C: Recent Labs    09/10/19 0610  HGBA1C 11.4*   CBG: Recent Labs  Lab 09/10/19 0431 09/10/19 0806 09/10/19 1150  GLUCAP 179* 200* 278*   Lipid Profile: No results for input(s): CHOL, HDL,  LDLCALC, TRIG, CHOLHDL, LDLDIRECT in the last 72 hours. Thyroid Function Tests: No results for input(s): TSH, T4TOTAL, FREET4, T3FREE, THYROIDAB in the last 72 hours. Anemia Panel: No results for input(s): VITAMINB12, FOLATE, FERRITIN, TIBC, IRON, RETICCTPCT in the last 72 hours. Urine analysis:    Component Value Date/Time   COLORURINE YELLOW (A) 09/09/2019 2239   APPEARANCEUR CLOUDY (A) 09/09/2019 2239   LABSPEC 1.030 09/09/2019 2239   PHURINE 5.0 09/09/2019 2239   GLUCOSEU >=500 (A) 09/09/2019 2239   HGBUR NEGATIVE 09/09/2019 2239   BILIRUBINUR NEGATIVE 09/09/2019 2239   KETONESUR 5 (A) 09/09/2019 2239   PROTEINUR 100 (A) 09/09/2019 2239   NITRITE NEGATIVE 09/09/2019 2239   LEUKOCYTESUR MODERATE (A) 09/09/2019 2239   Sepsis Labs: @LABRCNTIP (procalcitonin:4,lacticacidven:4)  ) Recent Results (from the past 240 hour(s))  Respiratory Panel by RT PCR (Flu A&B, Covid) - Nasopharyngeal Swab     Status: Abnormal   Collection Time: 09/09/19  7:05 PM   Specimen: Nasopharyngeal Swab  Result Value Ref Range Status   SARS Coronavirus 2 by RT PCR POSITIVE (A) NEGATIVE Final    Comment: RESULT CALLED TO, READ BACK BY AND VERIFIED WITH: 09/11/19 RN 2121 09/09/19 HNM (NOTE) SARS-CoV-2 target nucleic acids are DETECTED. SARS-CoV-2 RNA is generally detectable in upper respiratory specimens  during the acute phase of infection. Positive results are indicative of the presence of the identified virus, but do not rule out bacterial infection or co-infection with other pathogens not detected by the test. Clinical correlation with patient history and other diagnostic information is necessary to determine patient infection status. The expected result is Negative. Fact Sheet for Patients:  09/11/19 Fact Sheet for Healthcare Providers: https://www.moore.com/ This test is not yet approved or cleared by the https://www.young.biz/ FDA and  has been  authorized for detection and/or diagnosis of SARS-CoV-2 by FDA under an Emergency Use Authorization (EUA).  This EUA will remain in effect (meaning this test can be used) for  the duration of  the COVID-19 declaration under Section 564(b)(1) of the Act, 21 U.S.C. section 360bbb-3(b)(1), unless the authorization is terminated or revoked sooner.    Influenza A by PCR NEGATIVE NEGATIVE Final   Influenza B by PCR NEGATIVE NEGATIVE Final    Comment: (NOTE) The Xpert Xpress SARS-CoV-2/FLU/RSV assay is intended as an aid in  the diagnosis of influenza from Nasopharyngeal swab specimens and  should not be used as a sole basis for treatment. Nasal washings and  aspirates are unacceptable for Xpert Xpress SARS-CoV-2/FLU/RSV  testing. Fact Sheet for Patients: [place] Fact Sheet for Healthcare Providers: https://www.moore.com/ This test is not yet approved or cleared by the https://www.young.biz/ FDA and  has been authorized for detection and/or diagnosis of SARS-CoV-2 by  FDA under an Emergency Use Authorization (EUA). This EUA will remain  in effect (meaning this test can be used) for the duration of the  Covid-19 declaration under Section 564(b)(1) of the Act, 21  U.S.C. section 360bbb-3(b)(1), unless the authorization is  terminated or revoked. Performed at [facility], [address], [place],  [address]          Radiology Studies: DG Chest 2 View  Result Date: 09/09/2019 CLINICAL DATA:  Cough, nausea, generalized body aches, and malaise for several days. EXAM: CHEST - 2 VIEW COMPARISON:  None. FINDINGS: The cardiomediastinal silhouette is within normal limits. The lungs are hypoinflated. There are ill-defined patchy airspace opacities in the left greater than right mid and lower lungs. No pleural effusion or pneumothorax is identified. Right upper quadrant abdominal surgical clips are noted. No acute osseous abnormality is  seen. IMPRESSION: Patchy bilateral lung opacities concerning for pneumonia including atypical/viral etiologies. Electronically Signed   By: Logan Bores M.D.   On: 09/09/2019 18:59   CT Renal Stone Study  Result Date: 09/10/2019 CLINICAL DATA:  COVID positive.  Cough, nausea, body aches EXAM: CT ABDOMEN AND PELVIS WITHOUT CONTRAST TECHNIQUE: Multidetector CT imaging of the abdomen and pelvis was performed following the standard protocol without IV contrast. COMPARISON:  None. FINDINGS: Lower chest: Ground-glass airspace opacities in the lower lobes bilaterally. No effusions. Heart is normal size. Hepatobiliary: Prior cholecystectomy. Diffuse fatty infiltration of the liver. No focal hepatic abnormality. Pancreas: No focal abnormality or ductal dilatation. Spleen: No focal abnormality.  Normal size. Adrenals/Urinary Tract: No adrenal abnormality. No focal renal abnormality. No stones or hydronephrosis. Urinary bladder is unremarkable. Stomach/Bowel: Stomach, large and small bowel grossly unremarkable. Normal appendix. Vascular/Lymphatic: No evidence of aneurysm or adenopathy. Reproductive: Uterus and adnexa unremarkable.  No mass. Other: No free fluid or free air. Musculoskeletal: No acute bony abnormality. IMPRESSION: Diffuse fatty infiltration of the liver. No acute findings in the abdomen or pelvis. Ground-glass and linear airspace opacities in both lower lobes could reflect atelectasis or pneumonia. Electronically Signed   By: Rolm Baptise M.D.   On: 09/10/2019 01:06        Scheduled Meds: . albuterol  2 puff Inhalation Q6H  . vitamin C  500 mg Oral Daily  . dexamethasone  6 mg Oral Q24H  . enoxaparin (LOVENOX) injection  40 mg Subcutaneous Q24H  . insulin aspart  0-20 Units Subcutaneous Q4H  . insulin detemir  9 Units Subcutaneous Daily  . lidocaine  1 patch Transdermal Q24H  . linagliptin  5 mg Oral Daily  . multivitamin with minerals  1 tablet Oral Daily  . zinc sulfate  220 mg Oral Daily    Continuous Infusions: . [START ON 09/11/2019] remdesivir 100 mg in NS 100 mL       LOS: 0 days    Time spent: 25 minutes    Edwin Dada, MD [facility] 09/10/2019, 1:43 PM     Please page though [place] or Epic secure chat:  For password, contact charge nurse

## 2019-09-10 NOTE — Plan of Care (Signed)
Treat infection prior to Discharge Maintain a patent airway and comply with treatments

## 2019-09-10 NOTE — TOC Initial Note (Signed)
Transition of Care [facility]) - Initial/Assessment Note    Patient Details  Name: Carolyn Rios MRN: [id number] Date of Birth: 11-27-1975  Transition of Care [facility]) CM/SW Contact:    Shelbie Hutching, RN Phone Number: 09/10/2019, 2:48 PM  Clinical Narrative:                 Patient admitted with COVID currently requiring acute O2 at 2L via [place].  Patient is from home where she lives with her adult daughter Carolyn Rios and 2 other minor children.  Patient is independent and works at [employer] in [place].  Patient does not drive but reports that her daughter or son provide transportation for her.  Patient does not currently have any insurance or a PCP.  Referral placed to [facility] and application will be given to the patient to fill out.  Referral also made to [facility] for medication assistance.  RNCM will get discharge prescriptions filled for patient at [facility] and arrange to have them delivered to the patient's room before discharge.  Patient understands that she needs to fill out the application given to continue to get prescriptions filled there.   Patient is a new diabetic and has no glucose monitoring supplies at home.  Donated Blood Glucose Monitor, test strips and Lancet given to patient for home use.   RNCM will cont to follow patient through discharge.   Expected Discharge Plan: Home/Self Care Barriers to Discharge: Continued Medical Work up   Patient Goals and CMS Choice        Expected Discharge Plan and Services Expected Discharge Plan: Home/Self Care   Discharge Planning Services: CM Consult   Living arrangements for the past 2 months: Apartment                                      Prior Living Arrangements/Services Living arrangements for the past 2 months: Apartment Lives with:: Adult Children, Minor Children Patient language and need for interpreter reviewed:: Yes(Spanish interpreter used) Do you feel safe going back to the  place where you live?: Yes      Need for Family Participation in Patient Care: Yes (Comment)(COVID) Care giver support system in place?: Yes (comment)(Adult daughter and son)   Criminal Activity/Legal Involvement Pertinent to Current Situation/Hospitalization: No - Comment as needed  Activities of Daily Living Home Assistive Devices/Equipment: None ADL Screening (condition at time of admission) Patient's cognitive ability adequate to safely complete daily activities?: Yes Is the patient deaf or have difficulty hearing?: No Does the patient have difficulty seeing, even when wearing glasses/contacts?: No Does the patient have difficulty concentrating, remembering, or making decisions?: No Patient able to express need for assistance with ADLs?: Yes Does the patient have difficulty dressing or bathing?: No Independently performs ADLs?: Yes (appropriate for developmental age) Does the patient have difficulty walking or climbing stairs?: No Weakness of Legs: None Weakness of Arms/Hands: None  Permission Sought/Granted Permission sought to share information with : Case Manager, Family Supports, Other (comment) Permission granted to share information with : Yes, Verbal Permission Granted     Permission granted to share info w AGENCY: [facility] and [facility]  Permission granted to share info w Relationship: Daughter Carolyn Rios     Emotional Assessment   Attitude/Demeanor/Rapport: Engaged Affect (typically observed): Accepting Orientation: : Oriented to Self, Oriented to Place, Oriented to  Time, Oriented to Situation Alcohol / Substance  Use: Not Applicable Psych Involvement: No (comment)  Admission diagnosis:  Urinary tract infection without hematuria, site unspecified [N39.0] New onset type 2 diabetes mellitus (HCC) [E11.9] Pneumonia due to COVID-19 virus [U07.1, J12.82] COVID-19 [U07.1] Patient Active Problem List   Diagnosis Date Noted  . Pneumonia due to  COVID-19 virus 09/10/2019  . Hypoxia 09/10/2019  . UTI (urinary tract infection) 09/10/2019  . Hyperglycemia due to type 2 diabetes mellitus (HCC) 09/10/2019  . Anemia affecting pregnancy 08/19/2014  . Gestational diabetes mellitus in third trimester 08/19/2014  . Multigravida of advanced maternal age in third trimester 08/19/2014  . Supervision of high-risk pregnancy 08/19/2014   PCP:  Tessa Lerner, NP Pharmacy:   [facility] [address] ([address]), [place] - [address] [place]) [place] [address] Phone: [phone] Fax: [phone]     Social Determinants of Health (SDOH) Interventions    Readmission Risk Interventions No flowsheet data found.

## 2019-09-10 NOTE — ED Notes (Signed)
Called daughter Mardene Celeste [phone] and provided update, with patient's permission to do so.

## 2019-09-10 NOTE — ED Notes (Signed)
PT put on 2L [place] per Manson Passey, MD

## 2019-09-11 DIAGNOSIS — N390 Urinary tract infection, site not specified: Secondary | ICD-10-CM | POA: Diagnosis not present

## 2019-09-11 DIAGNOSIS — U071 COVID-19: Secondary | ICD-10-CM | POA: Diagnosis not present

## 2019-09-11 DIAGNOSIS — J1282 Pneumonia due to coronavirus disease 2019: Secondary | ICD-10-CM | POA: Diagnosis not present

## 2019-09-11 LAB — COMPREHENSIVE METABOLIC PANEL
ALT: 118 U/L — ABNORMAL HIGH (ref 0–44)
AST: 74 U/L — ABNORMAL HIGH (ref 15–41)
Albumin: 3.1 g/dL — ABNORMAL LOW (ref 3.5–5.0)
Alkaline Phosphatase: 120 U/L (ref 38–126)
Anion gap: 7 (ref 5–15)
BUN: 11 mg/dL (ref 6–20)
CO2: 28 mmol/L (ref 22–32)
Calcium: 9 mg/dL (ref 8.9–10.3)
Chloride: 104 mmol/L (ref 98–111)
Creatinine, Ser: 0.45 mg/dL (ref 0.44–1.00)
GFR calc Af Amer: >60 mL/min (ref >60)
GFR calc non Af Amer: >60 mL/min (ref >60)
Glucose, Bld: 205 mg/dL — ABNORMAL HIGH (ref 70–99)
Potassium: 3.6 mmol/L (ref 3.5–5.1)
Sodium: 139 mmol/L (ref 135–145)
Total Bilirubin: 0.6 mg/dL (ref 0.3–1.2)
Total Protein: 6.9 g/dL (ref 6.5–8.1)

## 2019-09-11 LAB — CBC WITH DIFFERENTIAL/PLATELET
Abs Immature Granulocytes: 0.04 10*3/uL (ref 0.00–0.07)
Basophils Absolute: 0 10*3/uL (ref 0.0–0.1)
Basophils Relative: 0 %
Eosinophils Absolute: 0 10*3/uL (ref 0.0–0.5)
Eosinophils Relative: 0 %
HCT: 42.9 % (ref 36.0–46.0)
Hemoglobin: 14.7 g/dL (ref 12.0–15.0)
Immature Granulocytes: 1 %
Lymphocytes Relative: 31 %
Lymphs Abs: 1.9 10*3/uL (ref 0.7–4.0)
MCH: 31 pg (ref 26.0–34.0)
MCHC: 34.3 g/dL (ref 30.0–36.0)
MCV: 90.5 fL (ref 80.0–100.0)
Monocytes Absolute: 0.5 10*3/uL (ref 0.1–1.0)
Monocytes Relative: 8 %
Neutro Abs: 3.7 10*3/uL (ref 1.7–7.7)
Neutrophils Relative %: 60 %
Platelets: 195 10*3/uL (ref 150–400)
RBC: 4.74 MIL/uL (ref 3.87–5.11)
RDW: 12.1 % (ref 11.5–15.5)
WBC: 6.3 10*3/uL (ref 4.0–10.5)
nRBC: 0 % (ref 0.0–0.2)

## 2019-09-11 LAB — GLUCOSE, CAPILLARY
Glucose-Capillary: 201 mg/dL — ABNORMAL HIGH (ref 70–99)
Glucose-Capillary: 223 mg/dL — ABNORMAL HIGH (ref 70–99)
Glucose-Capillary: 236 mg/dL — ABNORMAL HIGH (ref 70–99)
Glucose-Capillary: 250 mg/dL — ABNORMAL HIGH (ref 70–99)
Glucose-Capillary: 298 mg/dL — ABNORMAL HIGH (ref 70–99)

## 2019-09-11 LAB — FIBRIN DERIVATIVES D-DIMER (ARMC ONLY): Fibrin derivatives D-dimer (ARMC): 329.12 ng/mL (FEU) (ref 0.00–499.00)

## 2019-09-11 LAB — C-REACTIVE PROTEIN: CRP: 6.1 mg/dL — ABNORMAL HIGH (ref <1.0)

## 2019-09-11 LAB — PROCALCITONIN: Procalcitonin: <0.1 ng/mL

## 2019-09-11 MED ORDER — GLIPIZIDE 5 MG PO TABS: 5.0000 mg | ORAL_TABLET | Freq: Every day | ORAL | 0 refills | Status: DC

## 2019-09-11 MED ORDER — DEXAMETHASONE 6 MG PO TABS: 6.0000 mg | ORAL_TABLET | Freq: Two times a day (BID) | ORAL | 0 refills | Status: DC

## 2019-09-11 MED ORDER — METFORMIN HCL 500 MG PO TABS: 1000.0000 mg | ORAL_TABLET | Freq: Two times a day (BID) | ORAL | 3 refills | Status: DC

## 2019-09-11 MED ORDER — SODIUM CHLORIDE 0.9 % IV SOLN
INTRAVENOUS | Status: DC | PRN
  Administered 2019-09-11: 08:00:00 25 mL via INTRAVENOUS

## 2019-09-11 MED ORDER — ONDANSETRON HCL 4 MG PO TABS: 4.0000 mg | ORAL_TABLET | Freq: Three times a day (TID) | ORAL | 0 refills | Status: AC | PRN

## 2019-09-11 MED ORDER — PREDNISONE 10 MG PO TABS: 30.0000 mg | ORAL_TABLET | Freq: Every day | ORAL | 0 refills | Status: AC

## 2019-09-11 MED ORDER — ONDANSETRON HCL 4 MG/2ML IJ SOLN
4.0000 mg | Freq: Four times a day (QID) | INTRAMUSCULAR | Status: DC | PRN
  Administered 2019-09-11: 4 mg via INTRAVENOUS
  Filled 2019-09-11: qty 2

## 2019-09-11 MED ORDER — AMOXICILLIN 500 MG PO CAPS: 500.0000 mg | ORAL_CAPSULE | Freq: Three times a day (TID) | ORAL | 0 refills | Status: DC

## 2019-09-11 NOTE — Discharge Summary (Signed)
Physician Discharge Summary  Alyse Kathan DUK:[id number] DOB: [birth year] DOA: 09/09/2019  PCP: Tessa Lerner, NP  Admit date: 09/09/2019 Discharge date: 09/11/2019  Admitted From: Home  Disposition:  Home   Recommendations for Outpatient Follow-up:  1. Arrange PCP follow up as soon as possible 2. Obtain outpatient remdesivir infusion Thursday, Friday and Saturday 3. Obtain LFTs at outpatient follow up   Home Health: None  Equipment/Devices: None  Discharge Condition: Good  CODE STATUS: FULL Diet recommendation: Regular  Brief/Interim Summary: [name] is a 44 y.o. F with hx GDM who presented with 1 week cough, shortness of breath, no pleuritic chest pain and malaise.  In the ER, respirations 25, O2 sat 91% on room air, glucose 300.  CT of the abdomen and pelvis showed groundglass opacities in both lower lobes, Covid positive.         PRINCIPAL HOSPITAL DIAGNOSIS: COVID-19    Discharge Diagnoses:    COVID-19 pneumonitis with acute hypoxic respiratory failure Presented with shortness of breath, respiratory rate 25, oxygen saturation less than 90%.  Patient started on remdesivir and dexamethasone.  Titrated rapidly off O2 and remained off for 24 hours and able to ambulate without hypoxia.    Arranged to complete days 3-5 of remdesivir at the outpatient infusion center.     Diabetes Hemoglobin A1c 11%.  Not currently on treatment, has history of gestational diabetes.  Discharged with diabetes teaching, diet education, and glucometer.  Start metformin and glipizide at discharge.  Careful follow up with new PCP recommended.    Hypokalemia  UTI Noted to have pyuria at admission. Complained of dysuria.  Urine culture obtained, growing enterococcus.  Discharged on 5 days amoxicillin.             Discharge Instructions  Discharge Instructions    Discharge instructions   Complete by: As directed    From Dr. Maryfrances Bunnell: You were  admitted for coronavirus (Also known as COVID-19)  You were treated with an anti-virus medicine ("remdesivir") and an anti-inflammatory (a "steroid") while you were here.  You should finish the course of steroids by taking prednisone 30 mg (3 tabs) once daily for 6 more days  You should finish the course of remdesivir by going to the infusion clinic in [place] to complete therapy.   This clinic is at [address] [place] [address]  You are scheduled for an outpatient infusion of Remdesivir at  8:30 am on Thursday 1/28, Friday 1/29, and Saturday 1/30.  Please report to [facility] Catalan at [address]  (this is the same hospital as the former [facility] hospital in [place], where babies were born). Drive to the security guard and tell them you are here for an infusion. They will direct you to the front entrance where the nurses will come and get you.  For questions call [phone].   If you have any lingering cough, you should take the cough syrup we gave you here, Robitussin (with the ingredients "GUIAFENESIN" and "DEXTROMETHORPHAN")  You should purchase a pulse oximeter at your pharmacy. This is a device that you put on your finger to measure your oxygen level.  They are available at any pharmacy. Use it to check your oxygen level twice daily until you see your primary care doctor. If your oxygen level is ever LESS than 88% and doesn't get better, you should call your primary care doctor immediately.   HOW LONG TO REMAIN IN QUARANTINE: There is no absolutely correct answer to  this and so our best answer is to be on the cautious side.  Based on what we know of the virus, you should isolate strictly until 21 days from your first symptoms.  Until you end your quarantine: If you have anyone in the home who has NOT had coronavirus:    -do not be in the same room with them until your self isolation is over    -if you MUST be in the same room, make sure you wear a mask  and have them wear a mask and safety glasses (if available)    -clean all hard surfaces (counters, doors, tables) twice a day    -use a separate bathroom at all times       For the new diabetes: Start taking metformin (the medicine for diabetes) Take metformin 500 mg (1 tablet) once daily for 2-3 days Then increase to 500 mg twice daily (once in the morning and once at night) for 2-3 days Keep increasing the dose every 2-3 days until you are taking 1000 mg (2 tabs) in the morning and 2 tabs at night  Also, take glipizide 5mg  (1 tablet) once in the morning. Glipizide is the medicine that will lower your sugar more, so only take one tablet per day until you see your primary care doctor, who can decide whether to increase the dose or not.  Check your blood sugar every morning. A "normal" blood sugar in the morning when you wake up is between 70 and 120  A "normal" blood sugar after eating is 120 to 180  If your blood sugar is ever less than 70, you might have symptoms of shakes, fatigue, nausea, dizziness.  If this happens, drink some juice and check your sugar again in 15 minutes.  If the symptoms don't go away, go to the ER.  If your sugar is every greater than 400, call your new primary care doctor    [facility]   For the urinary tract infection (bladder infection) Take the antibiotic amoxicillin 500 mg three times daily with meals for 5 days Call the [facility] to follow up the skin redness we talked about   Increase activity slowly   Complete by: As directed      Allergies as of 09/11/2019   No Known Allergies     Medication List    STOP taking these medications   clotrimazole 1 % external solution Commonly known as: LOTRIMIN   oxyCODONE 5 MG immediate release tablet Commonly known as: Oxy IR/ROXICODONE     TAKE these medications   amoxicillin 500 MG capsule Commonly known as: AMOXIL Take 1  capsule (500 mg total) by mouth 3 (three) times daily.   glipiZIDE 5 MG tablet Commonly known as: GLUCOTROL Take 1 tablet (5 mg total) by mouth daily before breakfast.   metFORMIN 500 MG tablet Commonly known as: Glucophage Take 2 tablets (1,000 mg total) by mouth 2 (two) times daily with a meal.   ondansetron 4 MG tablet Commonly known as: Zofran Take 1 tablet (4 mg total) by mouth every 8 (eight) hours as needed for up to 12 days for nausea or vomiting.   predniSONE 10 MG tablet Commonly known as: DELTASONE Take 3 tablets (30 mg total) by mouth daily for 6 days.      Follow-up Information    [facility]. Schedule an appointment as soon as possible for a visit in  3 week(s).   Specialty: Primary Care Why: Get liver function tests checked Contact information: Antelope Zayante [phone]         No Known Allergies  Consultations:     Procedures/Studies: DG Chest 2 View  Result Date: 09/09/2019 CLINICAL DATA:  Cough, nausea, generalized body aches, and malaise for several days. EXAM: CHEST - 2 VIEW COMPARISON:  None. FINDINGS: The cardiomediastinal silhouette is within normal limits. The lungs are hypoinflated. There are ill-defined patchy airspace opacities in the left greater than right mid and lower lungs. No pleural effusion or pneumothorax is identified. Right upper quadrant abdominal surgical clips are noted. No acute osseous abnormality is seen. IMPRESSION: Patchy bilateral lung opacities concerning for pneumonia including atypical/viral etiologies. Electronically Signed   By: Logan Bores M.D.   On: 09/09/2019 18:59   CT Renal Stone Study  Result Date: 09/10/2019 CLINICAL DATA:  COVID positive.  Cough, nausea, body aches EXAM: CT ABDOMEN AND PELVIS WITHOUT CONTRAST TECHNIQUE: Multidetector CT imaging of the abdomen and pelvis was performed following the standard protocol without IV contrast.  COMPARISON:  None. FINDINGS: Lower chest: Ground-glass airspace opacities in the lower lobes bilaterally. No effusions. Heart is normal size. Hepatobiliary: Prior cholecystectomy. Diffuse fatty infiltration of the liver. No focal hepatic abnormality. Pancreas: No focal abnormality or ductal dilatation. Spleen: No focal abnormality.  Normal size. Adrenals/Urinary Tract: No adrenal abnormality. No focal renal abnormality. No stones or hydronephrosis. Urinary bladder is unremarkable. Stomach/Bowel: Stomach, large and small bowel grossly unremarkable. Normal appendix. Vascular/Lymphatic: No evidence of aneurysm or adenopathy. Reproductive: Uterus and adnexa unremarkable.  No mass. Other: No free fluid or free air. Musculoskeletal: No acute bony abnormality. IMPRESSION: Diffuse fatty infiltration of the liver. No acute findings in the abdomen or pelvis. Ground-glass and linear airspace opacities in both lower lobes could reflect atelectasis or pneumonia. Electronically Signed   By: Rolm Baptise M.D.   On: 09/10/2019 01:06       Subjective: Dysuria, redness and irritation of the external vagina.  No fever, chest pain, dyspnea, shortness of breath, confusion, hemoptysis, sputum, leg pain, leg swelling.  Has a mild cough.  All history collected through video phonic interpreter.  Discharge Exam: Vitals:   09/10/19 2330 09/11/19 0723  BP:  97/64  Pulse: 77 81  Resp: 13 18  Temp:  97.6 F (36.4 C)  SpO2: 92% 95%   Vitals:   09/10/19 2300 09/10/19 2315 09/10/19 2330 09/11/19 0723  BP:  122/79  97/64  Pulse: 77 73 77 81  Resp: 15 16 13 18   Temp:  97.9 F (36.6 C)  97.6 F (36.4 C)  TempSrc:  Oral  Oral  SpO2: 92% 92% 92% 95%  Weight:      Height:        General: Pt is alert, awake, not in acute distress Cardiovascular: RRR, nl S1-S2, no murmurs appreciated.   No LE edema.   Respiratory: Normal respiratory rate and rhythm.  CTAB without rales or wheezes. Abdominal: Abdomen soft and non-tender.   No distension or HSM.   Neuro/Psych: Strength symmetric in upper and lower extremities.  Judgment and insight appear normal.   The results of significant diagnostics from this hospitalization (including imaging, microbiology, ancillary and laboratory) are listed below for reference.     Microbiology: Recent Results (from the past 240 hour(s))  Respiratory Panel by RT PCR (Flu A&B, Covid) - Nasopharyngeal Swab     Status: Abnormal  Collection Time: 09/09/19  7:05 PM   Specimen: Nasopharyngeal Swab  Result Value Ref Range Status   SARS Coronavirus 2 by RT PCR POSITIVE (A) NEGATIVE Final    Comment: RESULT CALLED TO, READ BACK BY AND VERIFIED WITH: Alger SimonsIGE JOHNSON RN 2121 09/09/19 HNM (NOTE) SARS-CoV-2 target nucleic acids are DETECTED. SARS-CoV-2 RNA is generally detectable in upper respiratory specimens  during the acute phase of infection. Positive results are indicative of the presence of the identified virus, but do not rule out bacterial infection or co-infection with other pathogens not detected by the test. Clinical correlation with patient history and other diagnostic information is necessary to determine patient infection status. The expected result is Negative. Fact Sheet for Patients:  https://www.moore.com/https://www.fda.gov/media/142436/download Fact Sheet for Healthcare Providers: https://www.young.biz/https://www.fda.gov/media/142435/download This test is not yet approved or cleared by the [place] FDA and  has been authorized for detection and/or diagnosis of SARS-CoV-2 by FDA under an Emergency Use Authorization (EUA).  This EUA will remain in effect (meaning this test can be used) for  the duration of  the COVID-19 declaration under Section 564(b)(1) of the Act, 21 U.S.C. section 360bbb-3(b)(1), unless the authorization is terminated or revoked sooner.    Influenza A by PCR NEGATIVE NEGATIVE Final   Influenza B by PCR NEGATIVE NEGATIVE Final    Comment: (NOTE) The Xpert Xpress SARS-CoV-2/FLU/RSV  assay is intended as an aid in  the diagnosis of influenza from Nasopharyngeal swab specimens and  should not be used as a sole basis for treatment. Nasal washings and  aspirates are unacceptable for Xpert Xpress SARS-CoV-2/FLU/RSV  testing. Fact Sheet for Patients: https://www.moore.com/https://www.fda.gov/media/142436/download Fact Sheet for Healthcare Providers: https://www.young.biz/https://www.fda.gov/media/142435/download This test is not yet approved or cleared by the [place] FDA and  has been authorized for detection and/or diagnosis of SARS-CoV-2 by  FDA under an Emergency Use Authorization (EUA). This EUA will remain  in effect (meaning this test can be used) for the duration of the  Covid-19 declaration under Section 564(b)(1) of the Act, 21  U.S.C. section 360bbb-3(b)(1), unless the authorization is  terminated or revoked. Performed at [facility], [address] [place], [place] [address]   Urine culture     Status: Abnormal (Preliminary result)   Collection Time: 09/09/19 10:39 PM   Specimen: Urine, Random  Result Value Ref Range Status   Specimen Description   Final    URINE, RANDOM Performed at [facility], [address] [place], [place] [address]    Special Requests   Final    NONE Performed at [facility], [address] [place], [place] [address]    Culture (A)  Final    20,000 COLONIES/mL ENTEROCOCCUS FAECALIS IDENTIFICATION AND SUSCEPTIBILITIES TO FOLLOW Performed at [facility], [address] [place], [place] [address]    Report Status PENDING  Incomplete     Labs: BNP (last 3 results) No results for input(s): BNP in the last 8760 hours. Basic Metabolic Panel: Recent Labs  Lab 09/09/19 1800 09/10/19 0610 09/11/19 0602  NA 136  --  139  K 3.2*  --  3.6  CL 100  --  104  CO2 32  --  28  GLUCOSE 300*  --  205*  BUN 10  --  11  CREATININE 0.73 0.52 0.45  CALCIUM 8.8*  --  9.0   Liver Function Tests: Recent Labs  Lab 09/09/19 1800  09/11/19 0602  AST 132* 74*  ALT 165* 118*  ALKPHOS 133* 120  BILITOT 0.6 0.6  PROT 7.9 6.9  ALBUMIN 3.7 3.1*   No results for input(s): LIPASE, AMYLASE in the last 168 hours. No results for input(s): AMMONIA in the last 168 hours. CBC: Recent Labs  Lab 09/09/19 1800 09/10/19 0610 09/11/19 0602  WBC 6.3 4.9 6.3  NEUTROABS 3.2  --  3.7  HGB 15.6* 14.4 14.7  HCT 45.8 44.0 42.9  MCV 91.8 94.4 90.5  PLT 197 173 195   Cardiac Enzymes: No results for input(s): CKTOTAL, CKMB, CKMBINDEX, TROPONINI in the last 168 hours. BNP: Invalid input(s): POCBNP CBG: Recent Labs  Lab 09/10/19 1949 09/11/19 0042 09/11/19 0400 09/11/19 0724 09/11/19 1110  GLUCAP 194* 236* 223* 201* 298*   D-Dimer No results for input(s): DDIMER in the last 72 hours. Hgb A1c Recent Labs    09/10/19 0610  HGBA1C 11.4*   Lipid Profile No results for input(s): CHOL, HDL, LDLCALC, TRIG, CHOLHDL, LDLDIRECT in the last 72 hours. Thyroid function studies No results for input(s): TSH, T4TOTAL, T3FREE, THYROIDAB in the last 72 hours.  Invalid input(s): FREET3 Anemia work up No results for input(s): VITAMINB12, FOLATE, FERRITIN, TIBC, IRON, RETICCTPCT in the last 72 hours. Urinalysis    Component Value Date/Time   COLORURINE YELLOW (A) 09/09/2019 2239   APPEARANCEUR CLOUDY (A) 09/09/2019 2239   LABSPEC 1.030 09/09/2019 2239   PHURINE 5.0 09/09/2019 2239   GLUCOSEU >=500 (A) 09/09/2019 2239   HGBUR NEGATIVE 09/09/2019 2239   BILIRUBINUR NEGATIVE 09/09/2019 2239   KETONESUR 5 (A) 09/09/2019 2239   PROTEINUR 100 (A) 09/09/2019 2239   NITRITE NEGATIVE 09/09/2019 2239   LEUKOCYTESUR MODERATE (A) 09/09/2019 2239   Sepsis Labs Invalid input(s): PROCALCITONIN,  WBC,  LACTICIDVEN Microbiology Recent Results (from the past 240 hour(s))  Respiratory Panel by RT PCR (Flu A&B, Covid) - Nasopharyngeal Swab     Status: Abnormal   Collection Time: 09/09/19  7:05 PM   Specimen: Nasopharyngeal Swab  Result  Value Ref Range Status   SARS Coronavirus 2 by RT PCR POSITIVE (A) NEGATIVE Final    Comment: RESULT CALLED TO, READ BACK BY AND VERIFIED WITH: Alger SimonsIGE JOHNSON RN 2121 09/09/19 HNM (NOTE) SARS-CoV-2 target nucleic acids are DETECTED. SARS-CoV-2 RNA is generally detectable in upper respiratory specimens  during the acute phase of infection. Positive results are indicative of the presence of the identified virus, but do not rule out bacterial infection or co-infection with other pathogens not detected by the test. Clinical correlation with patient history and other diagnostic information is necessary to determine patient infection status. The expected result is Negative. Fact Sheet for Patients:  https://www.moore.com/https://www.fda.gov/media/142436/download Fact Sheet for Healthcare Providers: https://www.young.biz/https://www.fda.gov/media/142435/download This test is not yet approved or cleared by the [place] FDA and  has been authorized for detection and/or diagnosis of SARS-CoV-2 by FDA under an Emergency Use Authorization (EUA).  This EUA will remain in effect (meaning this test can be used) for  the duration of  the COVID-19 declaration under Section 564(b)(1) of the Act, 21 U.S.C. section 360bbb-3(b)(1), unless the authorization is terminated or revoked sooner.    Influenza A by PCR NEGATIVE NEGATIVE Final   Influenza B by PCR NEGATIVE NEGATIVE Final    Comment: (NOTE) The Xpert Xpress SARS-CoV-2/FLU/RSV assay is intended as an aid in  the diagnosis of influenza from Nasopharyngeal swab specimens and  should not be used as a sole basis for treatment. Nasal washings and  aspirates are unacceptable for Xpert Xpress SARS-CoV-2/FLU/RSV  testing. Fact Sheet for Patients: https://www.moore.com/https://www.fda.gov/media/142436/download Fact  Sheet for Healthcare Providers: https://www.young.biz/ This test is not yet approved or cleared by the [place] and  has been authorized for detection and/or diagnosis of  SARS-CoV-2 by  FDA under an Emergency Use Authorization (EUA). This EUA will remain  in effect (meaning this test can be used) for the duration of the  Covid-19 declaration under Section 564(b)(1) of the Act, 21  U.S.C. section 360bbb-3(b)(1), unless the authorization is  terminated or revoked. Performed at [facility], [address], [place], [place] [address]   Urine culture     Status: Abnormal (Preliminary result)   Collection Time: 09/09/19 10:39 PM   Specimen: Urine, Random  Result Value Ref Range Status   Specimen Description   Final    URINE, RANDOM Performed at [facility], [address], [place], [place] [address]    Special Requests   Final    NONE Performed at [facility], [address] [place], [place] [address]    Culture (A)  Final    20,000 COLONIES/mL ENTEROCOCCUS FAECALIS IDENTIFICATION AND SUSCEPTIBILITIES TO FOLLOW Performed at [facility], [address], [place], [place] [address]    Report Status PENDING  Incomplete     Time coordinating discharge: 45 minutes      SIGNED:   Alberteen Sam, MD  [facility] 09/11/2019, 3:32 PM

## 2019-09-11 NOTE — TOC Transition Note (Signed)
Transition of Care [facility]) - CM/SW Discharge Note   Patient Details  Name: Carolyn Rios MRN: [id number] Date of Birth: 1976/07/28  Transition of Care [facility]) CM/SW Contact:  Allayne Butcher, RN Phone Number: 09/11/2019, 2:54 PM   Clinical Narrative:    Patient will discharge home today.  Patient reports that [name] can pick her up and take her home.  Prescriptions faxed to Medication Management.  This RNCM will pick up prescriptions and deliver to 1C bedside RN when they are ready.     Final next level of care: Home/Self Care Barriers to Discharge: Barriers Resolved   Patient Goals and CMS Choice        Discharge Placement                       Discharge Plan and Services   Discharge Planning Services: Medication Assistance, [facility], CM Consult                                 Social Determinants of Health (SDOH) Interventions     Readmission Risk Interventions No flowsheet data found.

## 2019-09-11 NOTE — Progress Notes (Signed)
Patient scheduled for outpatient Remdesivir infusion at 8:30 am on Thursday 1/28, Friday 1/29, and Saturday 1/30.   Please advise them to report to [facility] at [address].  Drive to the security guard and tell them you are here for an infusion. They will direct you to the front entrance where we will come and get you.  For questions call [phone].  Thanks

## 2019-09-11 NOTE — Progress Notes (Addendum)
Inpatient Diabetes Program Recommendations  AACE/ADA: New Consensus Statement on Inpatient Glycemic Control (2015)  Target Ranges:  Prepandial:   less than 140 mg/dL      Peak postprandial:   less than 180 mg/dL (1-2 hours)      Critically ill patients:  140 - 180 mg/dL   Lab Results  Component Value Date   GLUCAP 201 (H) 09/11/2019   HGBA1C 11.4 (H) 09/10/2019    Review of Glycemic Control Results for NYELAH, EMMERICH (MRN [id number]) as of 09/11/2019 10:58  Ref. Range 09/11/2019 00:42 09/11/2019 04:00 09/11/2019 07:24  Glucose-Capillary Latest Ref Range: 70 - 99 mg/dL 236 (H) 223 (H) 201 (H)     Diabetes history: New DM2 Outpatient Diabetes medications: None Current orders for Inpatient glycemic control: Levemir 9 units QD + Novolog 0-20 Q4H + Trajenta 5 mg QD + Decadron 6 mg QD  Inpatient Diabetes Program Recommendations:     Please consider  -Novolog 0-15 TID with meals -Novolog 0-5 QHS  -Novolog 3 units TID with meals  -Levemir 12 units daily   Addendum'@1215'$ -Spoke with patient via phone with interpreter.  Reviewed patient's current A1c of 11.4%. Explained what a A1c is and what it measures. Also reviewed goal A1c with patient, importance of good glucose control @ home, and blood sugar goals.  Spoke with pt about new diagnosis. Discussed A1C results with them and explained what an A1C is, basic pathophysiology of DM Type 2, basic home care, basic diabetes diet nutrition principles, importance of checking CBGs and maintaining good CBG control to prevent long-term and short-term complications. Reviewed signs and symptoms of hyperglycemia and hypoglycemia and how to treat hypoglycemia at home. Also reviewed blood sugar goals at home.  RNs to provide ongoing basic DM education at bedside with this patient. Have ordered educational booklet, insulin starter kit, and DM videos. Have also placed RD consult for DM diet education for this patient. Educated patient on CHO and "The Plate  Method"  Asked patient to check her blood sugar before breakfast, in the afternoon and before bedtime.  She is familiar with the glucometer.    Patient states prior to this hospitalization she would drink regular sodas, sweet tea and juices.  Discussed cutting out sugary drinks and explained this would be very helpful in bring decreasing her blood sugar.    Discussed oral medications and explained side effects of Metformin and that they will likely decrease after a couple of weeks.   Patient will follow up with [facility] as she does not have insurance.  Told patient to take glucometer with her to appointment for MD to review.        Thank you, Reche Dixon, RN, BSN Diabetes Coordinator Inpatient Diabetes Program [phone] (team pager from 8a-5p)  -

## 2019-09-11 NOTE — Progress Notes (Addendum)
Initial Nutrition Assessment  RD working remotely.  DOCUMENTATION CODES:   Not applicable  INTERVENTION:  Provide Ensure Max Protein po BID, each supplement provides 150 kcal and 30 grams of protein. Discharge order now in place and medication reconciliation has already been completed so unable to order this supplement.  Provided nutrition education regarding DM over the phone (note to follow).  NUTRITION DIAGNOSIS:   Increased nutrient needs related to catabolic illness(COVID-19) as evidenced by estimated needs.  GOAL:   Patient will meet greater than or equal to 90% of their needs  MONITOR:   PO intake, Supplement acceptance, Labs, Weight trends, I & O's  REASON FOR ASSESSMENT:   Consult Diet education  ASSESSMENT:   44 year old female with PMHx of GDM admitted with COVID-19 pneumonitis, new diagnosis of DM.   Spoke with patient over the phone with assistance from Spanish interpreter Orson Slick Laukaitis). Patient reports she has had a decreased appetite and intake. She is having N/V. She vomited after breakfast this morning. She was able to keep her lunch down but reports it was only liquids. Patient is amenable to drinking an ONS to help meet calorie/protein needs. Discussed increased needs related to catabolic nature of COVID-19. Although not usually appropriate to address nutrition education when patients are acutely ill with decreased appetite/intake, RD is concerned that due to barriers (Spanish-speaking, no insurance of PCP currently) patient would have limited access to nutrition education in the community.   Patient reports she can tell she has been losing weight. She is unsure of her UBW or current weight, though so she is unsure how much she has lost. According to chart she appears to weigh 72-73 kg usually. Unsure if current weight of 59 kg (130 lbs) is accurate or estimated.  Medications reviewed and include: vitamin C 500 mg daily, Decadron 6 mg Q24hrs, Novolog 0-20  units Q4hrs, Levemir 9 units daily, MVI daily, zinc sulfate 220 mg daily, remdesivir.  Labs reviewed: CBG 194-298. HgbA1c 11.4 on 1/26.  NUTRITION - FOCUSED PHYSICAL EXAM:  Unable to complete.  Diet Order:   Diet Order            Diet Carb Modified Fluid consistency: Thin; Room service appropriate? Yes  Diet effective now             EDUCATION NEEDS:   Education needs have been addressed  Skin:  Skin Assessment: Reviewed RN Assessment  Last BM:  09/09/2019  Height:   Ht Readings from Last 1 Encounters:  09/09/19 4\' 10"  (1.473 m)   Weight:   Wt Readings from Last 1 Encounters:  09/09/19 59 kg   BMI:  Body mass index is 27.17 kg/m.  Estimated Nutritional Needs:   Kcal:  1600-1800  Protein:  80-90 grams  Fluid:  1.6-1.8 L/day  09/11/19, MS, RD, LDN Office: [phone] Pager: [phone] After Hours/Weekend Pager: [phone]

## 2019-09-11 NOTE — TOC Progression Note (Signed)
Transition of Care [facility]) - Progression Note    Patient Details  Name: Carolyn Rios MRN: [id number] Date of Birth: Feb 04, 1976  Transition of Care [facility]) CM/SW Contact  Allayne Butcher, RN Phone Number: 09/11/2019, 4:12 PM  Clinical Narrative:    Medications delivered to 1C for RN to give to patient at discharge.    Expected Discharge Plan: Home/Self Care Barriers to Discharge: Barriers Resolved  Expected Discharge Plan and Services Expected Discharge Plan: Home/Self Care   Discharge Planning Services: Medication Assistance, [facility], CM Consult   Living arrangements for the past 2 months: Apartment Expected Discharge Date: 09/11/19                                     Social Determinants of Health (SDOH) Interventions    Readmission Risk Interventions No flowsheet data found.

## 2019-09-11 NOTE — Plan of Care (Signed)
  RD consulted for nutrition education regarding diabetes. RD working remotely.  Lab Results  Component Value Date   HGBA1C 11.4 (H) 09/10/2019    RD reviewed "Carbohydrate Counting for People with Diabetes" handout from the Academy of Nutrition and Dietetics with patient over the phone with interpreter Orson Slick Laukaitis). Will mail handout in Spanish to patient's home address. Discussed different food groups and their effects on blood sugar, emphasizing carbohydrate-containing foods. Provided list of carbohydrates and recommended serving sizes of common foods.  Discussed importance of controlled and consistent carbohydrate intake throughout the day. Provided examples of ways to balance meals/snacks and encouraged intake of high-fiber, whole grain complex carbohydrates. Teach back method used.  Expect good compliance.  Body mass index is 27.17 kg/m. Pt meets criteria for overweight based on current BMI.  Current diet order is carbohydrate modified. Patient has decreased appetite and intake. Labs and medications reviewed. RD contact information provided.  Felix Pacini, MS, RD, LDN Office: [phone] Pager: [phone] After Hours/Weekend Pager: [phone]

## 2019-09-12 ENCOUNTER — Ambulatory Visit (HOSPITAL_COMMUNITY)
Admit: 2019-09-12 | Discharge: 2019-09-12 | Disposition: A | Discharge status: Home / Self Care | Payer: MEDICAID | Attending: Pulmonary Disease | Admitting: Pulmonary Disease

## 2019-09-12 LAB — URINE CULTURE: Culture: 20000 — AB

## 2019-09-13 ENCOUNTER — Ambulatory Visit (HOSPITAL_COMMUNITY): Payer: MEDICAID

## 2019-09-14 ENCOUNTER — Ambulatory Visit (HOSPITAL_COMMUNITY): Payer: MEDICAID

## 2019-09-17 ENCOUNTER — Other Ambulatory Visit: Payer: Self-pay

## 2019-11-05 IMAGING — US ULTRASOUND LEFT BREAST LIMITED
1 series · 6 of 6 positions shown · non-contrast
Comparison: Previous exam(s).

CLINICAL DATA: Follow up left breast abscess. Patient has
previously been treated with Bactrim and had the abscess aspirated
on 04/09/2018. Cytology of the left breast aspirate revealed
abundant white blood cells present with no organisms seen. The
patient is following up with Dr. Hoadley.

EXAM:
ULTRASOUND OF THE LEFT BREAST

[Series 1: ultrasound left breast limited · 0.07mm/px · 6 of 6 slices shown]
[im 1/6]
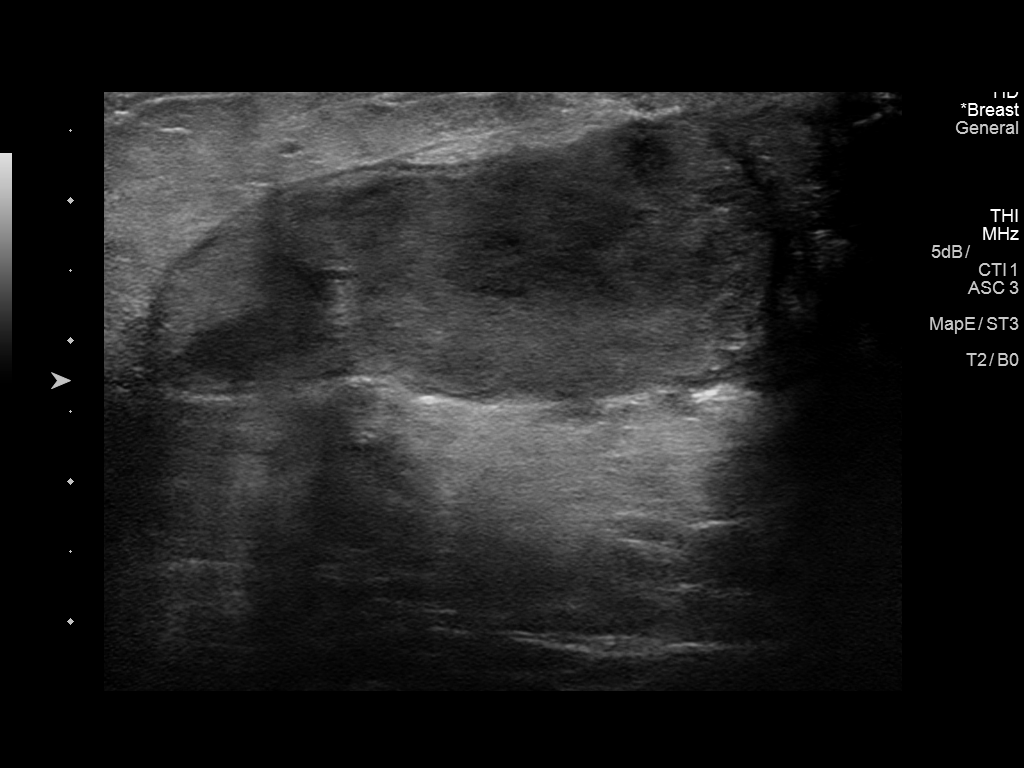
[im 2/6]
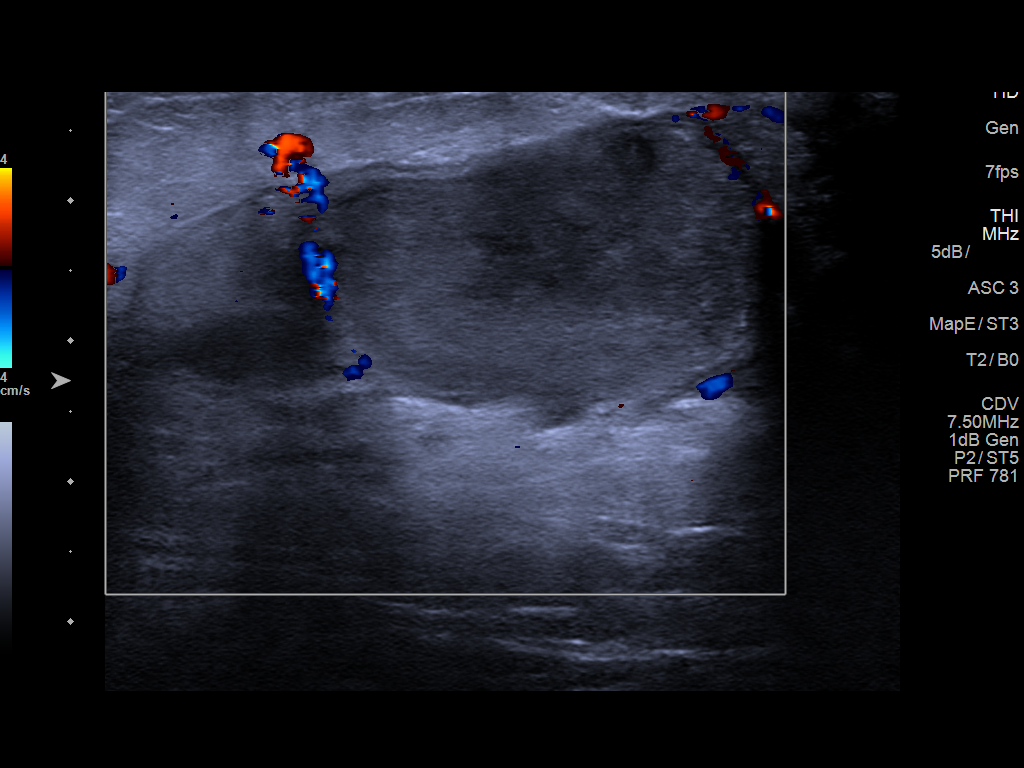
[im 3/6]
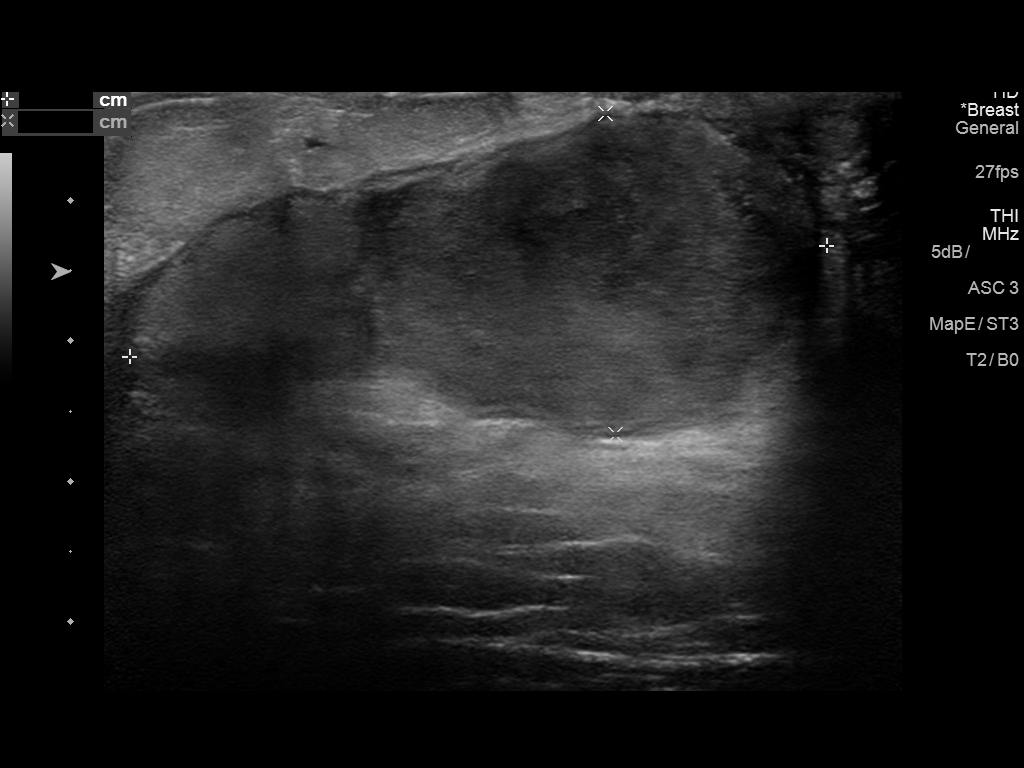
[im 4/6]
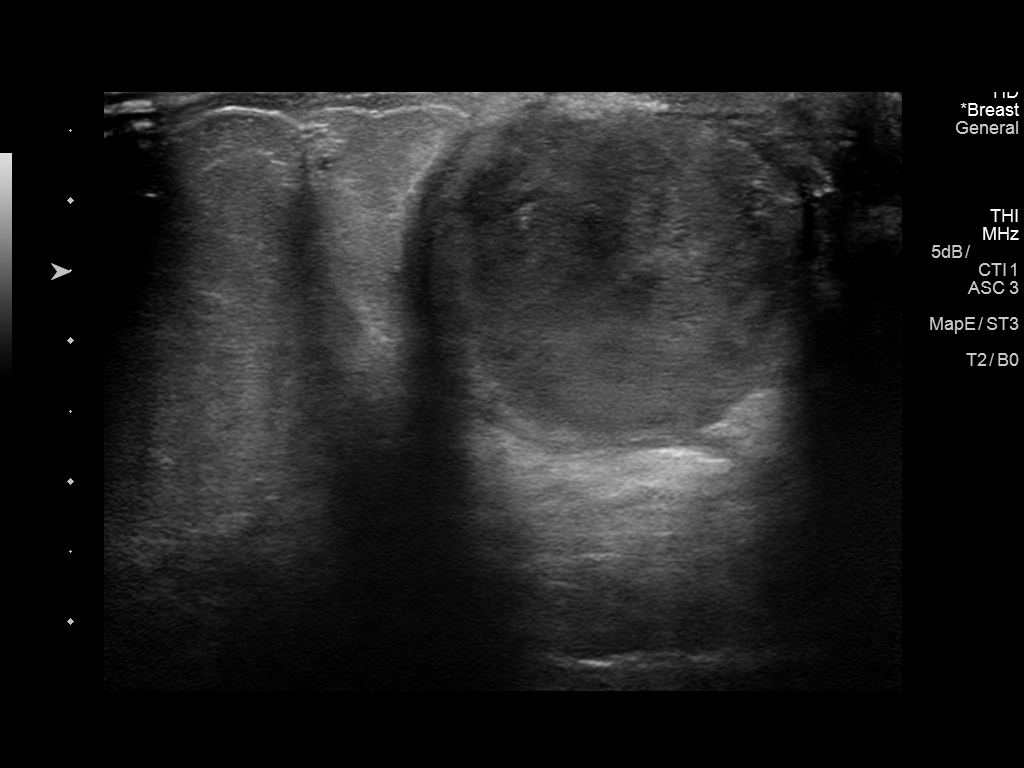
[im 5/6]
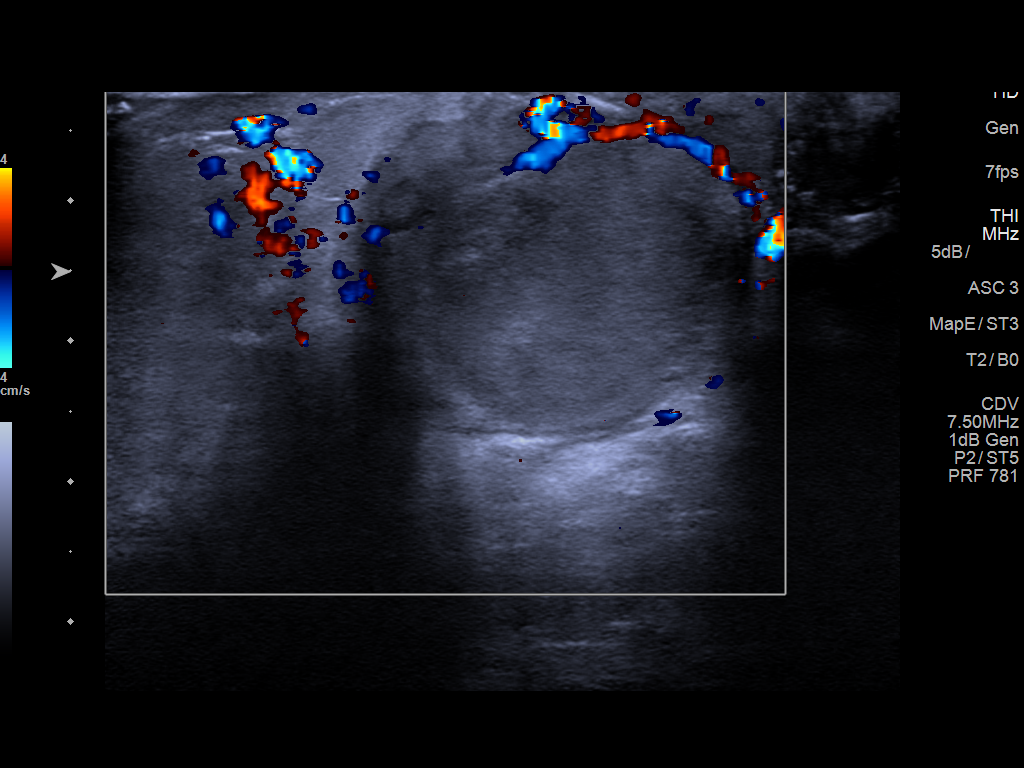
[im 6/6]
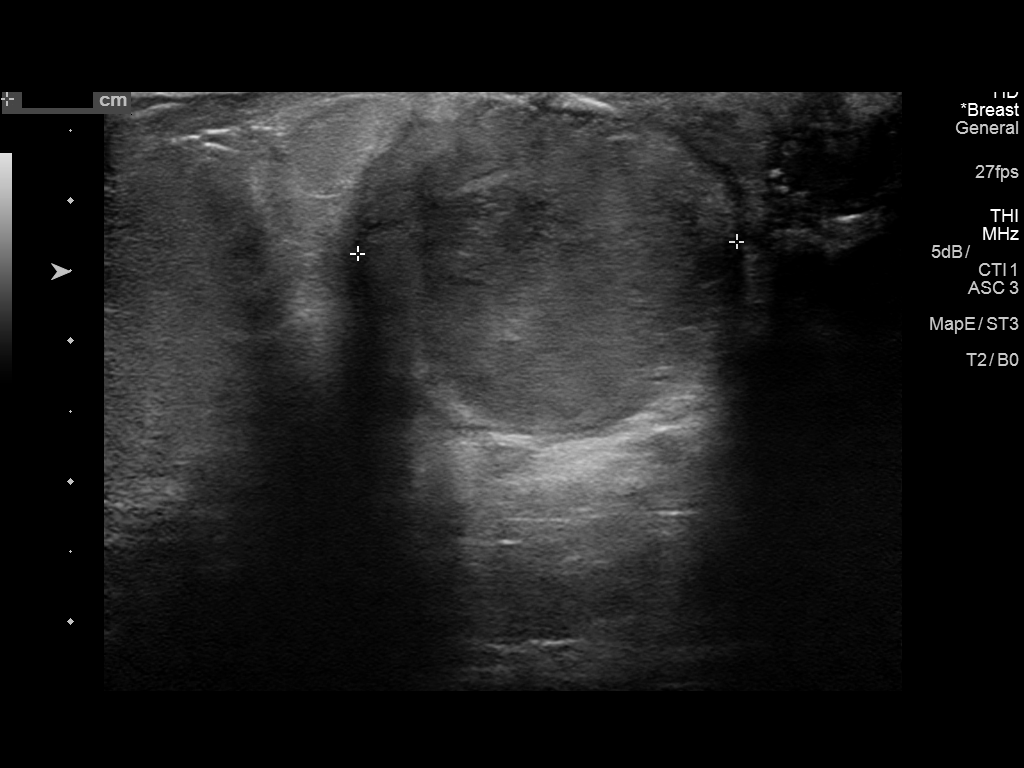

[6 of 6 positions shown; findings below may reference images not displayed]

FINDINGS: On physical exam, there is erythema in the medial aspect of the left
breast that is painful to palpation.

Targeted ultrasound is performed, showing a hypoechoic mass in the
left breast at 9 o'clock 2 cm from the nipple measuring 5.0 x 2.3 x
2.7 cm. On the prior ultrasound dated 04/09/2018 it measured 3.8 x
1.0 x 3.8 cm. Sonographic evaluation of the left axilla did not show
any enlarged adenopathy.
IMPRESSION: Enlarging left breast mass likely an abscess.

RECOMMENDATION:
Surgical treatment of the left breast abscess is recommended. Dr.
Hoadley was called and made aware of the sonographic findings. Dr.
Hoadley says he will do an open surgical drainage and biopsy the mass.

Patient is due for her bilateral screening mammogram in Saturday September, 2018.

I have discussed the findings and recommendations with the patient.
Results were also provided in writing at the conclusion of the
visit. If applicable, a reminder letter will be sent to the patient
regarding the next appointment.

BI-RADS CATEGORY  2: Benign.

## 2020-05-05 IMAGING — US US BREAST*R* LIMITED INC AXILLA
1 series · 5 of 5 positions shown · non-contrast
Comparison: [DATE] [DATE], [DATE], [DATE] [DATE], [DATE], [DATE] [DATE], [DATE]

CLINICAL DATA: 43-year-old patient has recently had bilateral
breast abscesses. She has had a total of 3 right breast aspirations.
She has completed at least 2 courses of antibiotics, and has another
prescription for antibiotics that she plans to fill today. The
palpable retroareolar fluid collection has returned. The most recent
aspiration was October 24, 2018. At that time, approximately 60 cc of
pus-like fluid was aspirated. A Spanish interpreter was present
throughout the patient's visit today.

EXAM:
ULTRASOUND OF THE RIGHT BREAST

[Series 1: us breast*right* limited inc axilla · 0.09mm/px · 5 of 5 slices shown]
[im 1/5]
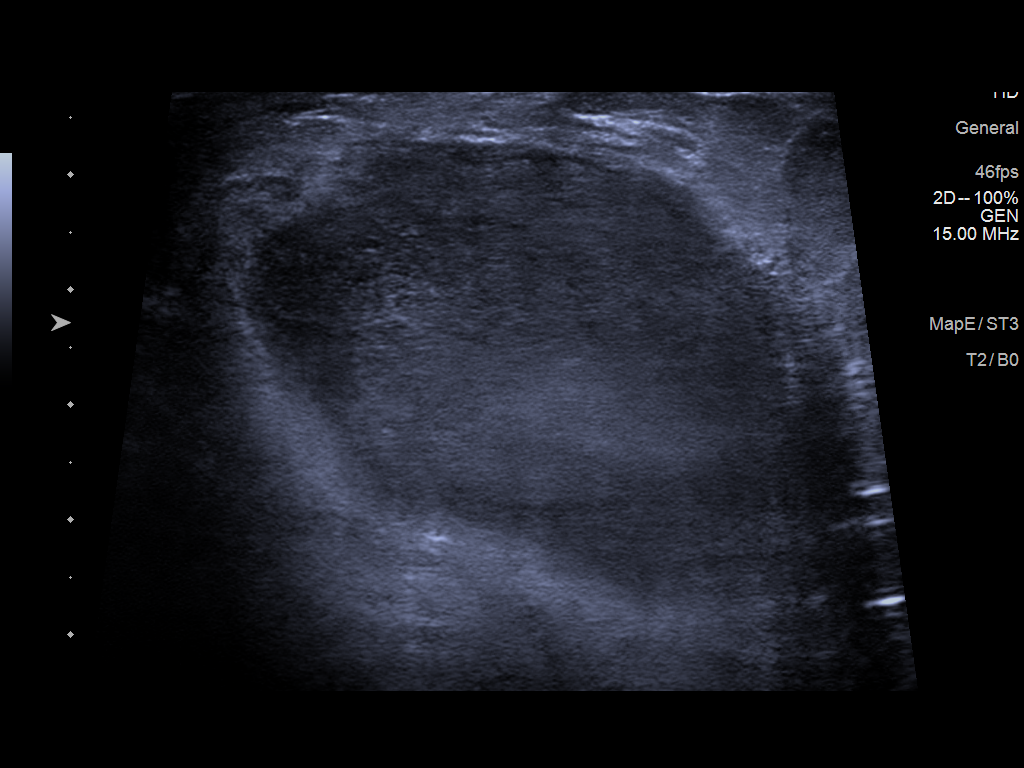
[im 2/5]
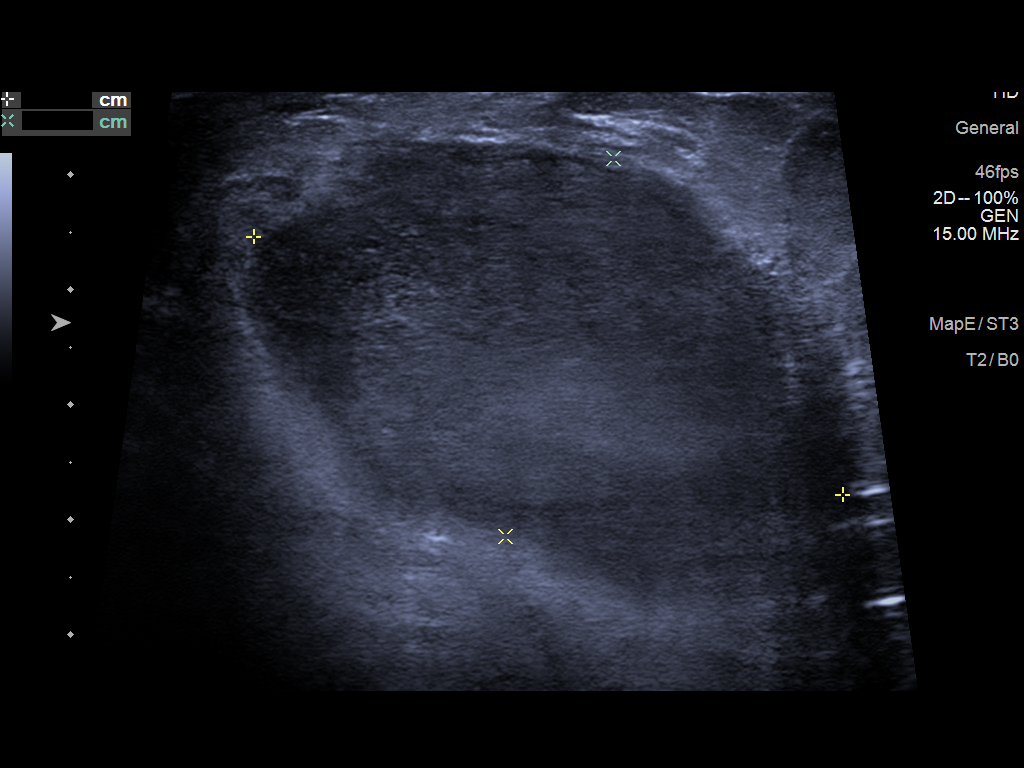
[im 3/5]
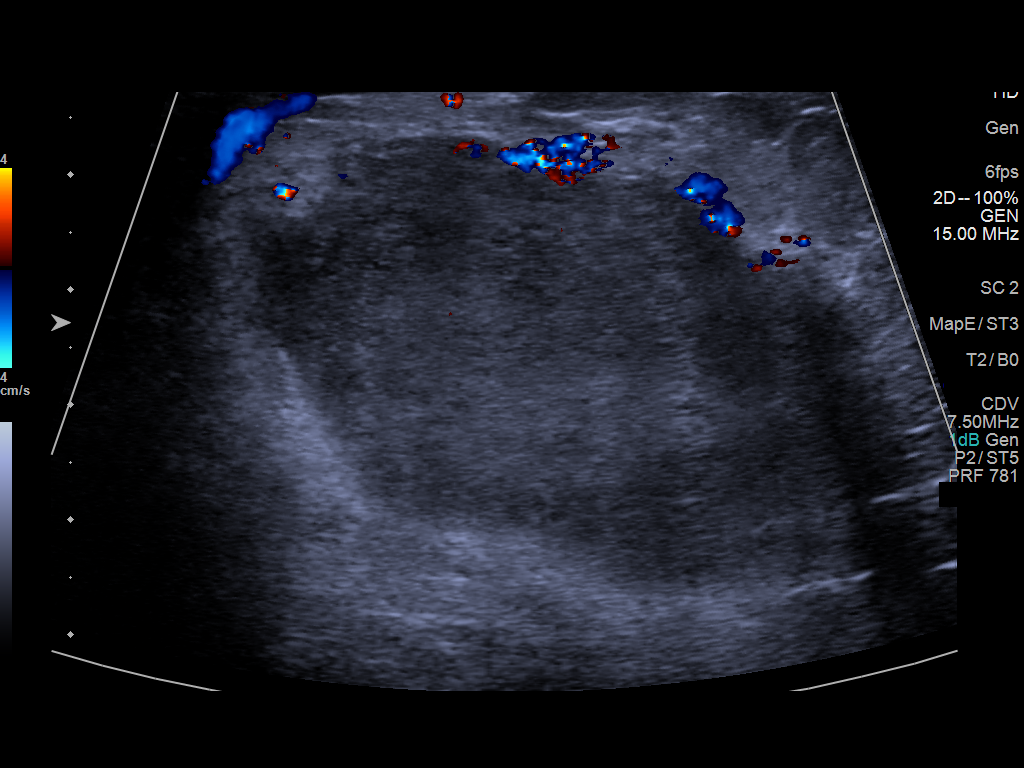
[im 4/5]
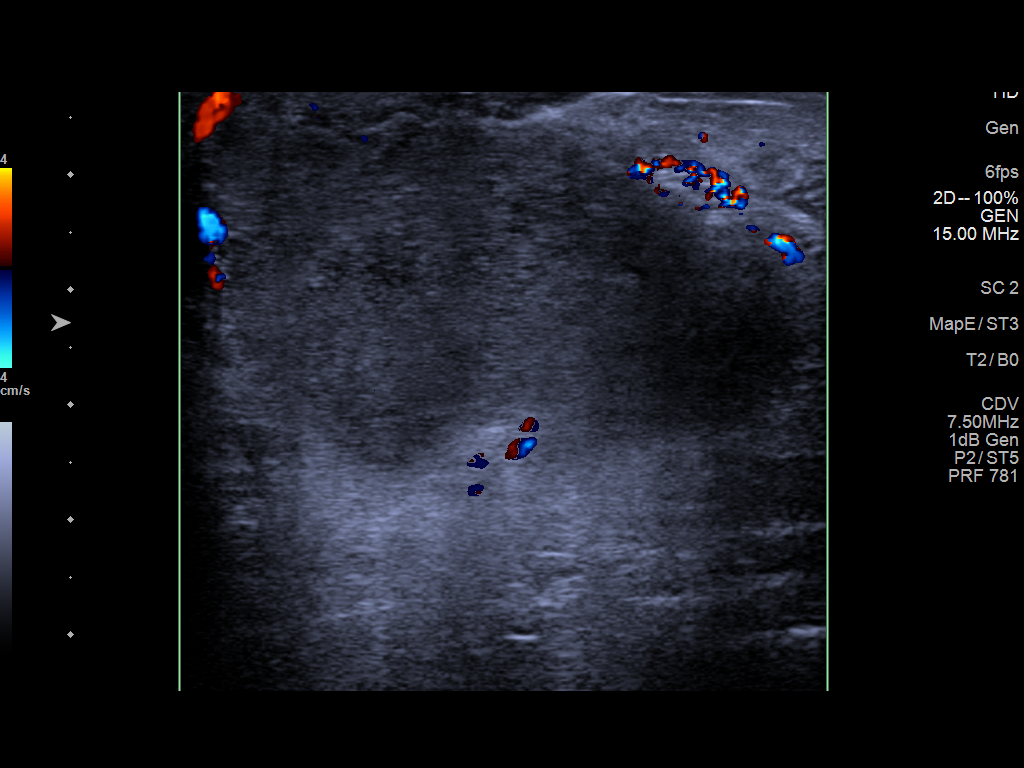
[im 5/5]
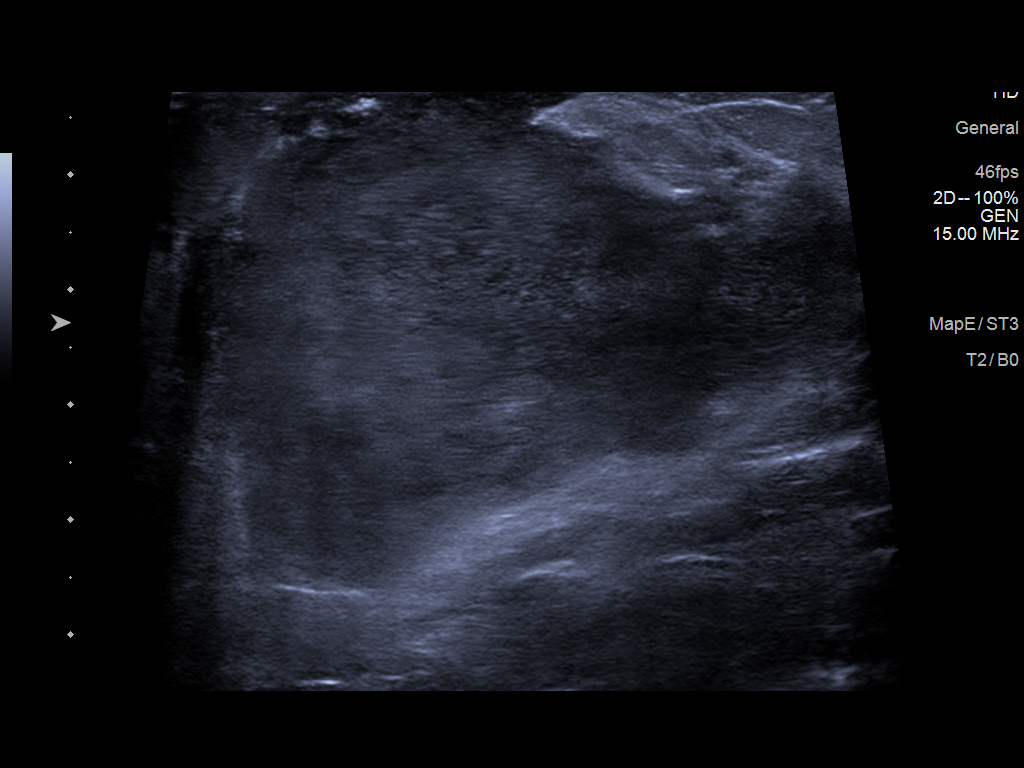

[5 of 5 positions shown; findings below may reference images not displayed]

FINDINGS: On physical exam, no definite erythema of the right breast is
appreciated. The patient is somewhat tender to palpation of a
retroareolar mass.

Targeted ultrasound is performed, showing a hypoechoic oval-shaped
probable complex fluid collection in the retroareolar right breast
measuring 5.6 x 3.4 x 4.9 cm. There is some peripheral vascularity.
No internal vascularity. Overlying skin thickness is just slightly
prominent, measuring up to 2.4 mm.
IMPRESSION: Recurrence retroareolar right breast complex fluid collection.

RECOMMENDATION:
An ultrasound-guided aspiration will be performed today.

I have discussed the findings and recommendations with the patient.
Results were also provided in writing at the conclusion of the
visit. If applicable, a reminder letter will be sent to the patient
regarding the next appointment.

BI-RADS CATEGORY  3: Probably benign.

## 2020-05-05 IMAGING — US US BREAST ASPIRATION
1 series · 4 of 4 positions shown · non-contrast
Comparison: Previous exams.
COMPARISON: Previous exams.

Addendum:
CLINICAL DATA: There is a recurrent retroareolar right breast fluid
complex collection. Dr. Ulusoy would like a sample of the aspirate
sent to the laboratory.

EXAM:
ULTRASOUND GUIDED RIGHT BREAST CYST ASPIRATION

[Series 1: us breast aspiration · 0.07mm/px · 4 of 4 slices shown]
[im 1/4]
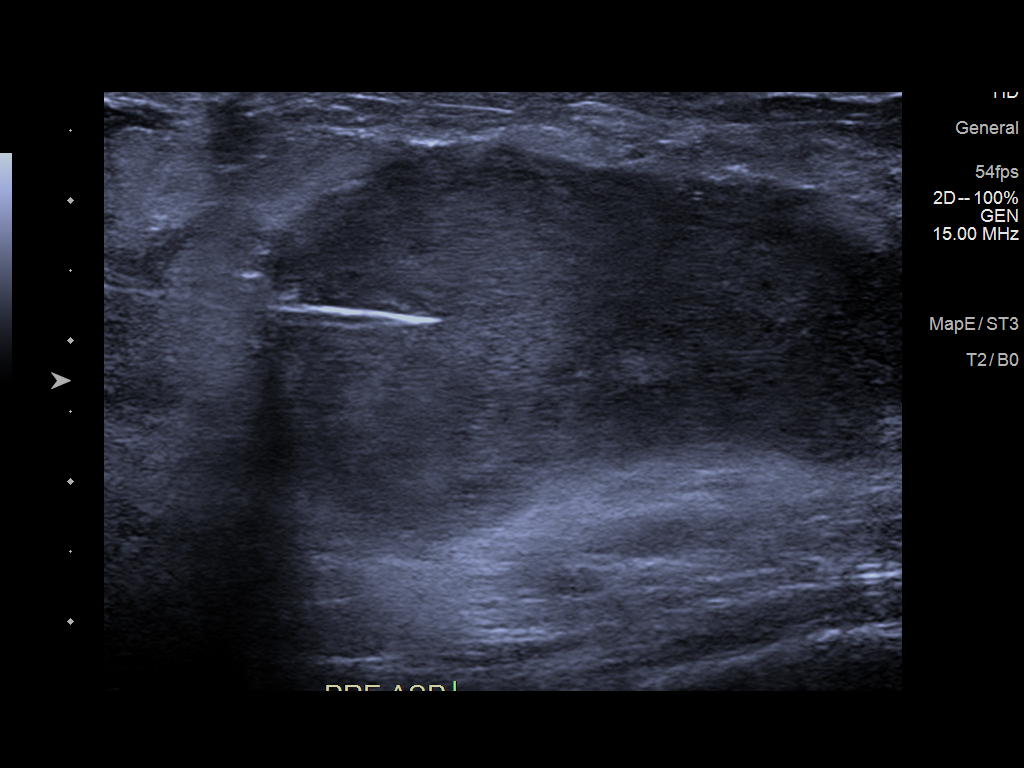
[im 2/4]
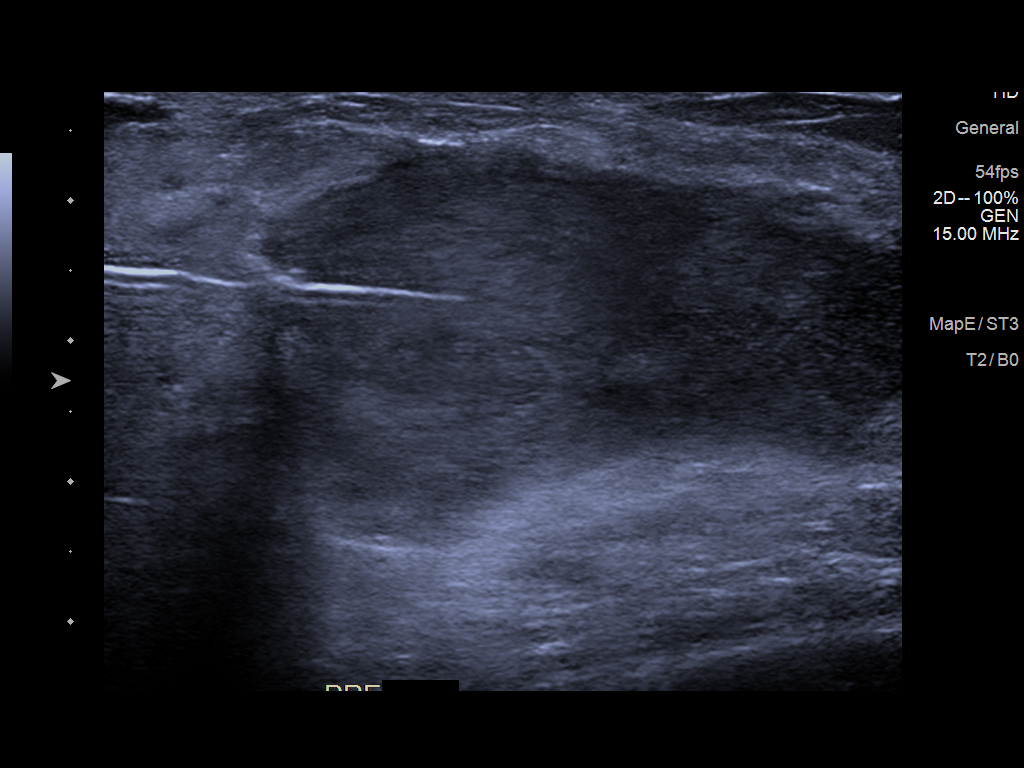
[im 3/4]
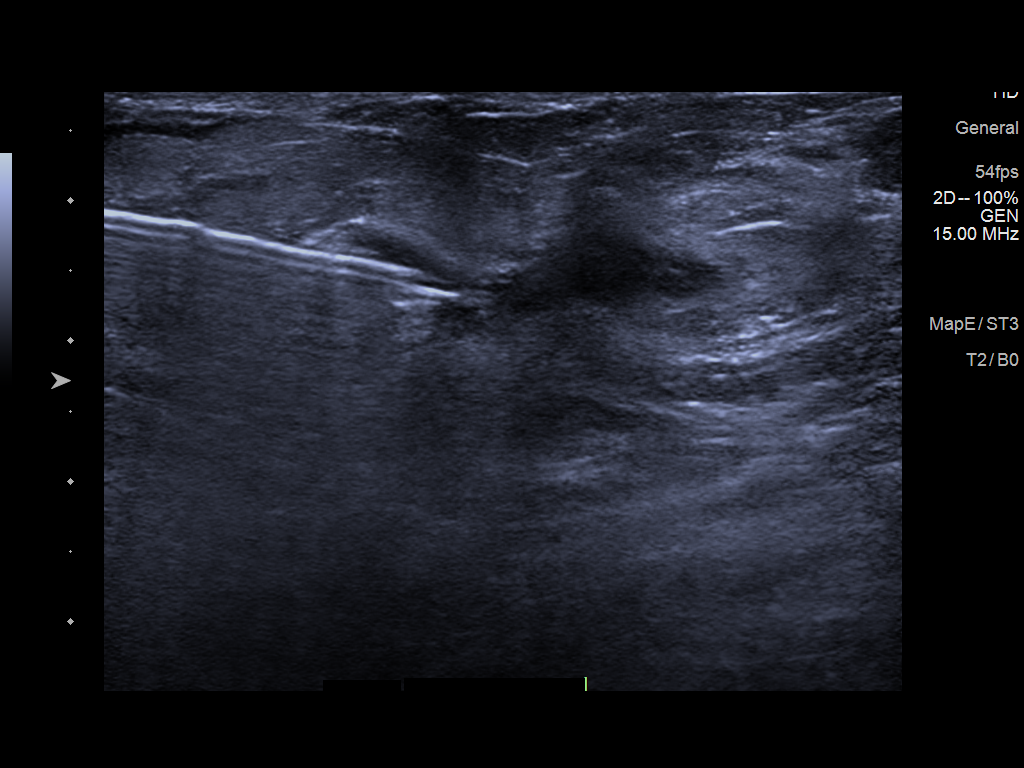
[im 4/4]
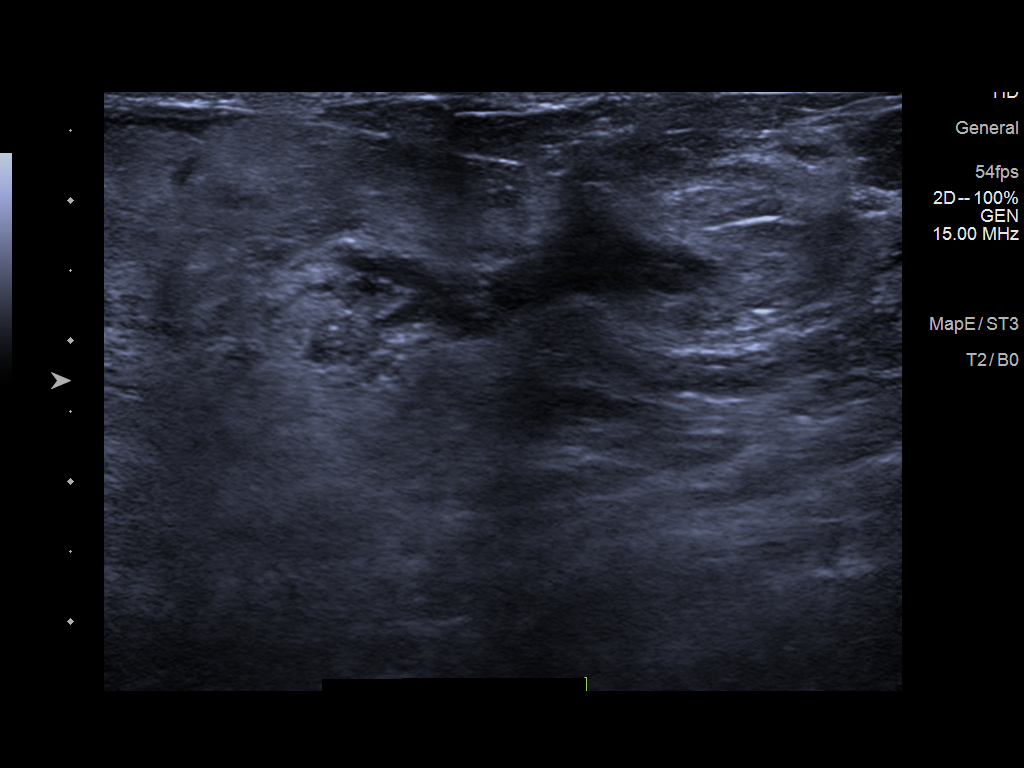

[4 of 4 positions shown; findings below may reference images not displayed]

PROCEDURE:
Using sterile technique, 1% lidocaine, under direct ultrasound
visualization, needle aspiration of a retroareolar complex fluid
collection was performed. Approximately 48 cc of dark red opaque
fluid was returned. Only minimal residual fluid remained in the
breast after the aspiration.
IMPRESSION: Ultrasound-guided aspiration of the right breast. No apparent
complications.

RECOMMENDATIONS:
Follow-up with Dr. Ulusoy in clinic. As needed, further evaluations
of either breast can be performed.

Additionally, the patient's annual bilateral mammogram is due in
September 2019.

ADDENDUM:
Gram stain of the RIGHT breast abscess aspirate revealed ABUNDANT
WBC PRESENT,BOTH PMN AND MONONUCLEAR. NO ORGANISMS SEEN

Culture of RIGHT breast abscess aspirate revealed NO GROWTH
AEROBICALLY OR ANAEROBICALLY.

The patient has a follow-up appointment with Dr. Ulusoy scheduled for

Addendum by Jorgenson, Mei on 11/19/2018.

*** End of Addendum ***
PROCEDURE:
Using sterile technique, 1% lidocaine, under direct ultrasound
visualization, needle aspiration of a retroareolar complex fluid
collection was performed. Approximately 48 cc of dark red opaque
fluid was returned. Only minimal residual fluid remained in the
breast after the aspiration.
IMPRESSION: Ultrasound-guided aspiration of the right breast. No apparent
complications.

RECOMMENDATIONS:
Follow-up with Dr. Ulusoy in clinic. As needed, further evaluations
of either breast can be performed.

Additionally, the patient's annual bilateral mammogram is due in
September 2019.

## 2020-11-05 ENCOUNTER — Ambulatory Visit: Payer: Self-pay | Admitting: Family Medicine

## 2020-11-05 ENCOUNTER — Other Ambulatory Visit: Payer: Self-pay

## 2020-11-05 ENCOUNTER — Ambulatory Visit: Payer: Self-pay

## 2020-11-05 ENCOUNTER — Encounter: Payer: Self-pay | Admitting: Family Medicine

## 2020-11-05 DIAGNOSIS — B373 Candidiasis of vulva and vagina: Secondary | ICD-10-CM

## 2020-11-05 DIAGNOSIS — B3731 Acute candidiasis of vulva and vagina: Secondary | ICD-10-CM

## 2020-11-05 DIAGNOSIS — Z113 Encounter for screening for infections with a predominantly sexual mode of transmission: Secondary | ICD-10-CM

## 2020-11-05 DIAGNOSIS — Z299 Encounter for prophylactic measures, unspecified: Secondary | ICD-10-CM

## 2020-11-05 LAB — WET PREP FOR TRICH, YEAST, CLUE
Trichomonas Exam: NEGATIVE
Yeast Exam: NEGATIVE

## 2020-11-05 MED ORDER — METRONIDAZOLE 500 MG PO TABS: 500.0000 mg | ORAL_TABLET | Freq: Two times a day (BID) | ORAL | 0 refills | Status: AC

## 2020-11-05 MED ORDER — CLOTRIMAZOLE 1 % VA CREA: 1.0000 | TOPICAL_CREAM | Freq: Every day | VAGINAL | 0 refills | Status: AC

## 2020-11-05 NOTE — Progress Notes (Signed)
[facility] Department STI clinic/screening visit  Subjective:  Carolyn Rios is a 45 y.o. female being seen today for an STI screening visit. The patient reports they do have symptoms.  Patient reports that they do not desire a pregnancy in the next year.   They reported they are not interested in discussing contraception today.  No LMP recorded. Patient has had an implant.   Patient has the following medical conditions:   Patient Active Problem List   Diagnosis Date Noted  . Pneumonia due to COVID-19 virus 09/10/2019  . Hypoxia 09/10/2019  . UTI (urinary tract infection) 09/10/2019  . Hyperglycemia due to type 2 diabetes mellitus (HCC) 09/10/2019  . Anemia affecting pregnancy 08/19/2014  . Gestational diabetes mellitus in third trimester 08/19/2014  . Multigravida of advanced maternal age in third trimester 08/19/2014  . Supervision of high-risk pregnancy 08/19/2014    Chief Complaint  Patient presents with  . SEXUALLY TRANSMITTED DISEASE    Screening     HPI  Patient reports itchiness in her vaginal area   Last HIV test per patient/review of record was 09/10/2019 Patient reports last pap was 06/28/2018  See flowsheet for further details and programmatic requirements.    The following portions of the patient's history were reviewed and updated as appropriate: allergies, current medications, past medical history, past social history, past surgical history and problem list.  Objective:  There were no vitals filed for this visit.  Physical Exam Vitals and nursing note reviewed.  Constitutional:      Appearance: Normal appearance.  HENT:     Head: Normocephalic and atraumatic.     Mouth/Throat:     Mouth: Mucous membranes are moist.     Pharynx: Oropharynx is clear. No oropharyngeal exudate or posterior oropharyngeal erythema.  Pulmonary:     Effort: Pulmonary effort is normal.  Chest:  Breasts:     Right: No axillary adenopathy or  supraclavicular adenopathy.     Left: No axillary adenopathy or supraclavicular adenopathy.    Abdominal:     General: Abdomen is flat.     Palpations: There is no mass.     Tenderness: There is no abdominal tenderness. There is no rebound.  Genitourinary:    General: Normal vulva.     Exam position: Lithotomy position.     Pubic Area: No pubic lice.      Labia:        Right: No rash or lesion.        Left: No rash or lesion.      Vagina: Normal. No vaginal discharge, erythema, bleeding or lesions.     Cervix: No cervical motion tenderness, discharge, friability, lesion or erythema.     Uterus: Normal.      Adnexa: Right adnexa normal and left adnexa normal.     Rectum: Normal.       Comments: External genitalia without, lice, nit, lesions or inguinal, adenopathy.  External area has erythema, and edema, and tender to touch  Vagina with normal mucosa and  White discharge and pH equals >4.  Cervix without visual lesions, uterus firm, mobile, non-tender, no masses, CMT adnexal fullness or tenderness.   Lymphadenopathy:     Head:     Right side of head: No preauricular or posterior auricular adenopathy.     Left side of head: No preauricular or posterior auricular adenopathy.     Cervical: No cervical adenopathy.     Upper Body:     Right upper body:  No supraclavicular or axillary adenopathy.     Left upper body: No supraclavicular or axillary adenopathy.     Lower Body: No right inguinal adenopathy. No left inguinal adenopathy.  Skin:    General: Skin is warm and dry.     Findings: No rash.  Neurological:     Mental Status: She is alert and oriented to person, place, and time.  Psychiatric:        Mood and Affect: Mood normal.        Behavior: Behavior normal.      Assessment and Plan:  Carolyn Rios is a 45 y.o. female presenting to the [facility] Department for STI screening  1. Screening examination for venereal disease Patient reports being  itchy x 1 month and tissue is "pink down there"   - Chlamydia/Gonorrhea [facility] - WET PREP FOR TRICH, YEAST, CLUE  2. Yeast infection of the vagina Patient treated with clotrimazole to assist with the itching.  Instructed patient to use on the vulva area.  Also advised to use  Cool cloth to assist soothe.     - clotrimazole (V-R CLOTRIMAZOLE VAGINAL) 1 % vaginal cream; Place 1 Applicatorful vaginally at bedtime for 7 days.  Dispense: 45 g; Refill: 0  3. Prophylactic measure  - metroNIDAZOLE (FLAGYL) 500 MG tablet; Take 1 tablet (500 mg total) by mouth 2 (two) times daily for 7 days.  Dispense: 14 tablet; Refill: 0  Patient accepted all screenings including wet prep,  vaginal CT/GC and bloodwork for HIV/RPR.  Patient meets criteria for HepB screening? No. Ordered? No - does not meet criteria  Patient meets criteria for HepC screening? No. Ordered? No - does not meet criteria   Wet prep results neg   Discussed time line for [facility] results and that patient will be called with positive results and encouraged patient to call if she had not heard in 2 weeks.  Counseled to return or seek care for continued or worsening symptoms Recommended condom use with all sex, non-latex condoms given.    Patient is currently using condoms  to prevent pregnancy.     Return if symptoms worsen or fail to improve.  No future appointments.   M. [place] & language line  used for Spanish interpretation.      Wendi Snipes, FNP

## 2020-11-07 NOTE — Progress Notes (Signed)
Chart reviewed by Pharmacist  Suzanne Walker PharmD, Contract Pharmacist at [facility]  

## 2021-05-19 ENCOUNTER — Ambulatory Visit: Payer: Self-pay

## 2021-05-21 ENCOUNTER — Ambulatory Visit: Payer: Self-pay

## 2021-05-21 ENCOUNTER — Ambulatory Visit: Payer: Self-pay | Admitting: Advanced Practice Midwife

## 2021-05-21 ENCOUNTER — Ambulatory Visit (LOCAL_COMMUNITY_HEALTH_CENTER): Payer: Self-pay | Admitting: Advanced Practice Midwife

## 2021-05-21 ENCOUNTER — Other Ambulatory Visit: Payer: Self-pay

## 2021-05-21 ENCOUNTER — Encounter: Payer: Self-pay | Admitting: Advanced Practice Midwife

## 2021-05-21 VITALS — BP 117/77 | Ht 60.0 in | Wt 141.6 lb

## 2021-05-21 DIAGNOSIS — A599 Trichomoniasis, unspecified: Secondary | ICD-10-CM | POA: Insufficient documentation

## 2021-05-21 DIAGNOSIS — Z550 Illiteracy and low-level literacy: Secondary | ICD-10-CM | POA: Insufficient documentation

## 2021-05-21 DIAGNOSIS — Z3009 Encounter for other general counseling and advice on contraception: Secondary | ICD-10-CM

## 2021-05-21 DIAGNOSIS — E663 Overweight: Secondary | ICD-10-CM

## 2021-05-21 LAB — WET PREP FOR TRICH, YEAST, CLUE
Trichomonas Exam: POSITIVE — AB
Yeast Exam: NEGATIVE

## 2021-05-21 MED ORDER — METRONIDAZOLE 500 MG PO TABS: 500.0000 mg | ORAL_TABLET | Freq: Two times a day (BID) | ORAL | 0 refills | Status: AC

## 2021-05-21 NOTE — Progress Notes (Signed)
[facility] [address] Main Number: [phone]    Family Planning Visit- Initial Visit  Subjective:  Carolyn Rios is a 45 y.o. SHF nonsmoker  G6P6 (29, 28, 22, 18, 7,6)  being seen today for an initial annual visit and to discuss contraceptive options.  The patient is currently using Nexplanon for pregnancy prevention. Patient reports she does not want a pregnancy in the next year.  Patient has the following medical conditions has Anemia affecting pregnancy; Gestational diabetes mellitus in third trimester; Multigravida of advanced maternal age in third trimester; Supervision of high-risk pregnancy; Pneumonia due to COVID-19 virus; Hypoxia; UTI (urinary tract infection); Hyperglycemia due to type 2 diabetes mellitus (HCC); and Overweight BMI=27.6 on their problem list.  Chief Complaint  Patient presents with   Contraception   Annual Exam    Patient reports here for physical and Nexplanon removal. Nexplanon inserted 02/08/18 and wants removed because she has a new partner who she hasn't had sex with, but she's afraid he will judge her when he finds out she has a Nexplanon because then he will think she has been having sex with her last partner.  Last sex 03/2021. LMP 8 months ago. Completed 3rd grade.  Never had dental exam. Never had eye exam. Last pap 08/03/17 neg HPV neg. Last ETOH 2021 (15 beers) at her daughter's wedding.  Living with her 2 youngest kids and working 40 hrs/wk.  Patient denies cigs, vaping, cigars, MJ  Body mass index is 27.65 kg/m. - Patient is eligible for diabetes screening based on BMI and age >55?  yes HA1C ordered? Pt refused  Patient reports 1  partner/s in last year. Desires STI screening?  Yes refuses bloodwork  Has patient been screened once for HCV in the past?  No  No results found for: HCVAB  Does the patient have current drug use (including MJ), have a partner with drug use,  and/or has been incarcerated since last result? No  If yes-- Screen for HCV through [facility]   Does the patient meet criteria for HBV testing? No  Criteria:  -Household, sexual or needle sharing contact with HBV -History of drug use -HIV positive -Those with known Hep C   Health Maintenance Due  Topic Date Due   COVID-19 Vaccine (1) Never done   FOOT EXAM  Never done   OPHTHALMOLOGY EXAM  Never done   URINE MICROALBUMIN  Never done   Hepatitis C Screening  Never done   TETANUS/TDAP  Never done   PAP SMEAR-Modifier  Never done   HEMOGLOBIN A1C  03/09/2020   INFLUENZA VACCINE  Never done    Review of Systems  Eyes:  Positive for blurred vision (blurry vision; has never had eye exam--urged eye exam asap).  All other systems reviewed and are negative.  The following portions of the patient's history were reviewed and updated as appropriate: allergies, current medications, past family history, past medical history, past social history, past surgical history and problem list. Problem list updated.   See flowsheet for other program required questions.  Objective:   Vitals:   05/21/21 1522  BP: 117/77  Weight: 141 lb 9.6 oz (64.2 kg)  Height: 5' (1.524 m)    Physical Exam Constitutional:      Appearance: Normal appearance. She is obese.  HENT:     Head: Normocephalic and atraumatic.     Mouth/Throat:     Mouth: Mucous membranes are moist.  Comments: Never had dental exam; urged asap Eyes:     Conjunctiva/sclera: Conjunctivae normal.  Neck:     Thyroid: No thyroid mass, thyromegaly or thyroid tenderness.  Cardiovascular:     Rate and Rhythm: Normal rate and regular rhythm.  Pulmonary:     Effort: Pulmonary effort is normal.     Breath sounds: Normal breath sounds.  Chest:  Breasts:    Right: Normal.     Left: Normal.  Abdominal:     Palpations: Abdomen is soft.     Comments: Poor tone, soft without masses  or tenderness  Genitourinary:    General:  Normal vulva.     Exam position: Lithotomy position.     Vagina: Vaginal discharge (grey malodorous leukorrhea, ph>4.5) present.     Cervix: Normal.     Uterus: Normal.      Adnexa: Right adnexa normal and left adnexa normal.     Rectum: Normal.  Musculoskeletal:        General: Normal range of motion.     Cervical back: Normal range of motion and neck supple.  Skin:    General: Skin is warm and dry.  Neurological:     Mental Status: She is alert.  Psychiatric:        Mood and Affect: Mood normal.      Assessment and Plan:  Carolyn Rios is a 45 y.o. female presenting to the [facility] for an initial annual wellness/contraceptive visit  Contraception counseling: Reviewed all forms of birth control options in the tiered based approach. available including abstinence; over the counter/barrier methods; hormonal contraceptive medication including pill, patch, ring, injection,contraceptive implant, ECP; hormonal and nonhormonal IUDs; permanent sterilization options including vasectomy and the various tubal sterilization modalities. Risks, benefits, and typical effectiveness rates were reviewed.  Questions were answered.  Written information was also given to the patient to review.  Patient desires to keep Nexplanon for now, this was prescribed for patient. She will follow up in  prn for surveillance.  She was told to call with any further questions, or with any concerns about this method of contraception.  Emphasized use of condoms 100% of the time for STI prevention.  Patient was offered ECP. ECP was not accepted by the patient. ECP counseling was not given - see RN documentation  1. Family planning Please give primary care MD list to pt and encourage apt ASAP (said she had gestational diabetes and was told that she still has diabetes now; refuses HgbA1c today) Treat wet mount per standing orders Immunization nurse consult Contact card for sex partner Long  discussion with pt re: Nexplanon is used for menses regulation as well as pregnancy prevention. Pt does not want pregnancy but was wanting to use condoms only for birth control with new partner. Discussed that Nexplanon is better at preventing pregnancy than condoms.  Pt decides to keep Nexplanon for now and think about it. - WET PREP FOR TRICH, YEAST, CLUE - Chlamydia/Gonorrhea [facility]  2. Overweight BMI=27.6      No follow-ups on file.  No future appointments.  Alberteen Spindle, CNM

## 2021-05-21 NOTE — Progress Notes (Signed)
See note from this date on other encounter

## 2021-05-21 NOTE — Progress Notes (Signed)
Pt here for PE.  Wet mount results reviewed and medication dispensed per Provider orders.  Kolbee Stallman M Gustav Knueppel, RN ° °

## 2022-03-31 ENCOUNTER — Ambulatory Visit: Payer: Self-pay

## 2022-09-01 ENCOUNTER — Other Ambulatory Visit: Payer: Self-pay

## 2022-09-01 DIAGNOSIS — Z1231 Encounter for screening mammogram for malignant neoplasm of breast: Secondary | ICD-10-CM

## 2022-10-03 ENCOUNTER — Ambulatory Visit: Payer: Self-pay

## 2022-11-01 ENCOUNTER — Ambulatory Visit: Payer: Self-pay | Attending: Hematology and Oncology | Admitting: Hematology and Oncology

## 2022-11-01 ENCOUNTER — Ambulatory Visit
Admission: RE | Admit: 2022-11-01 | Discharge: 2022-11-01 | Disposition: A | Discharge status: Home / Self Care | Payer: Self-pay | Source: Ambulatory Visit | Attending: Obstetrics and Gynecology | Admitting: Obstetrics and Gynecology

## 2022-11-01 ENCOUNTER — Ambulatory Visit: Payer: Self-pay

## 2022-11-01 VITALS — BP 108/82 | Wt 130.2 lb

## 2022-11-01 DIAGNOSIS — Z1231 Encounter for screening mammogram for malignant neoplasm of breast: Secondary | ICD-10-CM

## 2022-11-01 NOTE — Progress Notes (Signed)
[name] is a 47 y.o. female who presents to [facility] clinic today with no complaints.    Pap Smear: Pap not smear completed today. Last Pap smear was 2024 at [facility] clinic and was abnormal - HPV+ . Per patient has no history of an abnormal Pap smear. Last Pap smear result is available in Epic.   Physical exam: Breasts Breasts symmetrical. No skin abnormalities bilateral breasts. No nipple retraction bilateral breasts. No nipple discharge bilateral breasts. No lymphadenopathy. No lumps palpated bilateral breasts.  MM DIAG BREAST TOMO BILATERAL  Result Date: 10/08/2018 CLINICAL DATA:  Right breast pain, swelling and increased warmth for the past 2 weeks. She was given prescription for Augmentin on 10/03/2018 but did not get it filled due to inability to pay for the prescription. Status post surgery for a left breast abscess in November 2019. EXAM: DIGITAL DIAGNOSTIC BILATERAL MAMMOGRAM WITH CAD AND TOMO ULTRASOUND RIGHT BREAST COMPARISON:  Previous exam(s). ACR Breast Density Category c: The breast tissue is heterogeneously dense, which may obscure small masses. FINDINGS: 3D tomographic and 2D generated mammographic views of both breasts demonstrate an interval large, oval, circumscribed mass in the retroareolar right breast, centered inferiorly. There is postsurgical distortion in the left breast. No findings elsewhere in either breast suspicious for malignancy. Mammographic images were processed with CAD. On physical exam, there is an approximately 5 cm oval, firm, palpable mass in the retroareolar right breast, centered in the 6 o'clock position. This is markedly tender to palpation. No associated skin reddening. There is associated increased warmth. There are no palpable right axillary lymph nodes. Targeted ultrasound is performed, showing a 5.6 x 4.7 x 3.5 cm irregular, heterogeneous mass in the retroareolar right breast, centered in the 6 o'clock position. No internal  blood flow was seen with color Doppler. IMPRESSION: 5.6 cm probable abscess in the retroareolar right breast. RECOMMENDATION: Ultrasound-guided needle aspiration of the probable abscess in the retroareolar right breast. This is scheduled to follow. I have discussed the findings and recommendations with the patient. Results were also provided in writing at the conclusion of the visit. If applicable, a reminder letter will be sent to the patient regarding the next appointment. BI-RADS CATEGORY  3: Probably benign. Electronically Signed   By: Claudie Revering M.D.   On: 10/08/2018 14:35   MS DIGITAL DIAG TOMO UNI LEFT  Result Date: 02/23/2018 CLINICAL DATA:  Patient was called back from screening mammogram for a possible asymmetry in the left breast. EXAM: DIGITAL DIAGNOSTIC LEFT MAMMOGRAM WITH CAD AND TOMO ULTRASOUND LEFT BREAST COMPARISON:  Previous exam(s). ACR Breast Density Category c: The breast tissue is heterogeneously dense, which may obscure small masses. FINDINGS: Additional imaging of the left breast was performed. There is an asymmetry in the upper outer quadrant of the left breast without discrete mass, distortion or malignant type microcalcifications. Mammographic images were processed with CAD. On physical exam, I do not palpate a mass in the upper-outer quadrant of the left breast. Targeted ultrasound is performed, showing normal tissue throughout the upper and lateral aspect of the left breast. No solid or cystic mass, abnormal shadowing or distortion visualized. IMPRESSION: Probable benign asymmetry in the left breast. RECOMMENDATION: Short-term interval follow-up left mammogram in 6 months is recommended. I have discussed the findings and recommendations with the patient. Results were also provided in writing at the conclusion of the visit. If applicable, a reminder letter will be sent to the patient regarding the next appointment. BI-RADS CATEGORY  3: Probably  benign. Electronically Signed   By:  Lillia Mountain M.D.   On: 02/23/2018 10:39         Pelvic/Bimanual Pap is not indicated today    Smoking History: Patient has never smoked and was not referred to quit line.    Patient Navigation: Patient education provided. Access to services provided for patient through [place] interpreter provided. No transportation provided   Colorectal Cancer Screening: Per patient has never had colonoscopy completed No complaints today. FIT test negative 07/2022   Breast and Cervical Cancer Risk Assessment: Patient does not have family history of breast cancer, known genetic mutations, or radiation treatment to the chest before age 56. Patient does not have history of cervical dysplasia, immunocompromised, or DES exposure in-utero.  Risk Assessment   No risk assessment data     A: B[facility]CP exam without pap smear No complaints with benign exam.   P: [facility]. Appointment scheduled 11/01/22.  Melodye Ped, NP 11/01/2022 2:56 PM

## 2022-11-01 NOTE — Patient Instructions (Signed)
Taught Carolyn Rios about self breast awareness and gave educational materials to take home. Patient did not need a Pap smear today due to last Pap smear was in 2024 per patient. She will need repeat Pap smear next year due to HPV+ results. Let her know [facility] will cover Pap smears every 3 years unless has a history of abnormal Pap smears. Referred [facility] to the [facility] for diagnostic mamm[facility]. Appointment scheduled for 11/01/22  Patient aware of appointment and will be there. Let patient know will follow up with her within the next couple weeks with results. Kaely Hartman verbalized understanding.  Melodye Ped, NP 2:55 PM

## 2022-11-30 ENCOUNTER — Other Ambulatory Visit: Payer: Self-pay | Admitting: Hematology and Oncology

## 2022-11-30 DIAGNOSIS — N6452 Nipple discharge: Secondary | ICD-10-CM

## 2022-12-05 ENCOUNTER — Ambulatory Visit
Admission: RE | Admit: 2022-12-05 | Discharge: 2022-12-05 | Disposition: A | Discharge status: Home / Self Care | Payer: Self-pay | Source: Ambulatory Visit | Attending: Hematology and Oncology | Admitting: Hematology and Oncology

## 2022-12-05 ENCOUNTER — Ambulatory Visit: Payer: Self-pay | Attending: Hematology and Oncology | Admitting: Hematology and Oncology

## 2022-12-05 VITALS — BP 108/81 | Wt 132.2 lb

## 2022-12-05 DIAGNOSIS — N6452 Nipple discharge: Secondary | ICD-10-CM

## 2022-12-05 NOTE — Patient Instructions (Signed)
Taught Carolyn Rios about self breast awareness and gave educational materials to take home. Patient did not need a Pap smear today due to last Pap smear was in 2024 per patient. Let her know [facility] will cover Pap smears every 5 years unless has a history of abnormal Pap smears. [facility]. Appointment scheduled for 12/05/2022. Patient aware of appointment and will be there. Let patient know will follow up with her within the next couple weeks with results. [name] verbalized understanding.  Pascal Lux, NP 9:32 AM

## 2022-12-05 NOTE — Progress Notes (Signed)
Carolyn Rios is a 47 y.o. female who presents to [facility] clinic today with complaint of right bloody nipple discharge for one week.    Pap Smear: Pap not smear completed today. Last Pap smear was 08/2022 at [facility] clinic and was normal. Per patient has no history of an abnormal Pap smear. Last Pap smear result is available in Epic.   Physical exam: Breasts Breasts symmetrical. No skin abnormalities bilateral breasts. No nipple retraction bilateral breasts. No nipple discharge bilateral breasts. No lymphadenopathy. No lumps palpated bilateral breasts. MS DIGITAL SCREENING TOMO BILATERAL  Result Date: 11/03/2022 CLINICAL DATA:  Screening. EXAM: DIGITAL SCREENING BILATERAL MAMMOGRAM WITH TOMOSYNTHESIS AND CAD TECHNIQUE: Bilateral screening digital craniocaudal and mediolateral oblique mammograms were obtained. Bilateral screening digital breast tomosynthesis was performed. The images were evaluated with computer-aided detection. COMPARISON:  Previous exam(s). ACR Breast Density Category c: The breasts are heterogeneously dense, which may obscure small masses. FINDINGS: There are no findings suspicious for malignancy. IMPRESSION: No mammographic evidence of malignancy. A result letter of this screening mammogram will be mailed directly to the patient. RECOMMENDATION: Screening mammogram in one year. (Code:SM-B-01Y) BI-RADS CATEGORY  1: Negative. Electronically Signed   By: Harmon Pier M.D.   On: 11/03/2022 08:17   MM DIAG BREAST TOMO BILATERAL  Result Date: 10/08/2018 CLINICAL DATA:  Right breast pain, swelling and increased warmth for the past 2 weeks. She was given prescription for Augmentin on 10/03/2018 but did not get it filled due to inability to pay for the prescription. Status post surgery for a left breast abscess in November 2019. EXAM: DIGITAL DIAGNOSTIC BILATERAL MAMMOGRAM WITH CAD AND TOMO ULTRASOUND RIGHT BREAST COMPARISON:  Previous exam(s). ACR Breast Density Category c:  The breast tissue is heterogeneously dense, which may obscure small masses. FINDINGS: 3D tomographic and 2D generated mammographic views of both breasts demonstrate an interval large, oval, circumscribed mass in the retroareolar right breast, centered inferiorly. There is postsurgical distortion in the left breast. No findings elsewhere in either breast suspicious for malignancy. Mammographic images were processed with CAD. On physical exam, there is an approximately 5 cm oval, firm, palpable mass in the retroareolar right breast, centered in the 6 o'clock position. This is markedly tender to palpation. No associated skin reddening. There is associated increased warmth. There are no palpable right axillary lymph nodes. Targeted ultrasound is performed, showing a 5.6 x 4.7 x 3.5 cm irregular, heterogeneous mass in the retroareolar right breast, centered in the 6 o'clock position. No internal blood flow was seen with color Doppler. IMPRESSION: 5.6 cm probable abscess in the retroareolar right breast. RECOMMENDATION: Ultrasound-guided needle aspiration of the probable abscess in the retroareolar right breast. This is scheduled to follow. I have discussed the findings and recommendations with the patient. Results were also provided in writing at the conclusion of the visit. If applicable, a reminder letter will be sent to the patient regarding the next appointment. BI-RADS CATEGORY  3: Probably benign. Electronically Signed   By: Beckie Salts M.D.   On: 10/08/2018 14:35   MS DIGITAL DIAG TOMO UNI LEFT  Result Date: 02/23/2018 CLINICAL DATA:  Patient was called back from screening mammogram for a possible asymmetry in the left breast. EXAM: DIGITAL DIAGNOSTIC LEFT MAMMOGRAM WITH CAD AND TOMO ULTRASOUND LEFT BREAST COMPARISON:  Previous exam(s). ACR Breast Density Category c: The breast tissue is heterogeneously dense, which may obscure small masses. FINDINGS: Additional imaging of the left breast was performed. There  is an asymmetry in the upper  outer quadrant of the left breast without discrete mass, distortion or malignant type microcalcifications. Mammographic images were processed with CAD. On physical exam, I do not palpate a mass in the upper-outer quadrant of the left breast. Targeted ultrasound is performed, showing normal tissue throughout the upper and lateral aspect of the left breast. No solid or cystic mass, abnormal shadowing or distortion visualized. IMPRESSION: Probable benign asymmetry in the left breast. RECOMMENDATION: Short-term interval follow-up left mammogram in 6 months is recommended. I have discussed the findings and recommendations with the patient. Results were also provided in writing at the conclusion of the visit. If applicable, a reminder letter will be sent to the patient regarding the next appointment. BI-RADS CATEGORY  3: Probably benign. Electronically Signed   By: Baird Lyons M.D.   On: 02/23/2018 10:39          Pelvic/Bimanual Pap is not indicated today    Smoking History: Patient has never smoked and was not referred to quit line.    Patient Navigation: Patient education provided. Access to services provided for patient through [facility]CP program. Carolyn Rios interpreter provided. No transportation provided   Colorectal Cancer Screening: Per patient has never had colonoscopy completed No complaints today. FIT test negative 07/2022 per [facility]   Breast and Cervical Cancer Risk Assessment: Patient does not have family history of breast cancer, known genetic mutations, or radiation treatment to the chest before age 76. Patient does not have history of cervical dysplasia, immunocompromised, or DES exposure in-utero.  Risk Scores as of 12/05/2022     Carolyn Rios           5-year 0.91 %   Lifetime 8.29 %   This patient is [place] but has no documented birth country, so the [place] model used data from [place] patients to calculate their risk score. Document a birth country in the  Demographics activity for a more accurate score.         Last calculated by Narda Rutherford, LPN on 04/12/5620 at  9:19 AM        A: B[facility] exam without pap smear Complaint of right bloody nipple discharge. Recent screening mammogram was negative. Exam benign.  P: [facility] for [facility]. Appointment scheduled 12/05/2022.  Pascal Lux, NP 12/05/2022 9:29 AM

## 2022-12-06 ENCOUNTER — Other Ambulatory Visit: Payer: Self-pay | Admitting: Hematology and Oncology

## 2022-12-06 DIAGNOSIS — R928 Other abnormal and inconclusive findings on diagnostic imaging of breast: Secondary | ICD-10-CM

## 2022-12-15 ENCOUNTER — Ambulatory Visit
Admission: RE | Admit: 2022-12-15 | Discharge: 2022-12-15 | Disposition: A | Discharge status: Home / Self Care | Payer: Self-pay | Source: Ambulatory Visit | Attending: Hematology and Oncology | Admitting: Hematology and Oncology

## 2022-12-15 DIAGNOSIS — R928 Other abnormal and inconclusive findings on diagnostic imaging of breast: Secondary | ICD-10-CM

## 2022-12-15 HISTORY — PX: BREAST BIOPSY: SHX20

## 2022-12-15 MED ORDER — LIDOCAINE HCL (PF) 1 % IJ SOLN
2.0000 mL | Freq: Once | INTRAMUSCULAR | Status: AC
  Administered 2022-12-15: 2 mL via INTRADERMAL
  Filled 2022-12-15: qty 2

## 2022-12-15 MED ORDER — LIDOCAINE-EPINEPHRINE 1 %-1:100000 IJ SOLN
8.0000 mL | Freq: Once | INTRAMUSCULAR | Status: AC
  Administered 2022-12-15: 8 mL
  Filled 2022-12-15: qty 8

## 2022-12-16 LAB — SURGICAL PATHOLOGY

## 2022-12-19 ENCOUNTER — Encounter: Payer: Self-pay | Admitting: Nurse Practitioner

## 2022-12-20 ENCOUNTER — Encounter: Payer: Self-pay | Admitting: *Deleted

## 2022-12-20 DIAGNOSIS — N6452 Nipple discharge: Secondary | ICD-10-CM

## 2022-12-20 NOTE — Progress Notes (Unsigned)
Referral recieved from [facility] for benign breast mass.  Referral sent to [facility].  T

## 2022-12-30 ENCOUNTER — Ambulatory Visit: Payer: Self-pay | Admitting: Surgery

## 2023-01-16 ENCOUNTER — Encounter: Payer: Self-pay | Admitting: Surgery

## 2023-01-16 ENCOUNTER — Ambulatory Visit (INDEPENDENT_AMBULATORY_CARE_PROVIDER_SITE_OTHER): Payer: Self-pay | Admitting: Surgery

## 2023-01-16 VITALS — BP 109/73 | HR 90 | Temp 98.0°F | Ht 60.0 in | Wt 133.0 lb

## 2023-01-16 DIAGNOSIS — N6452 Nipple discharge: Secondary | ICD-10-CM

## 2023-01-16 NOTE — Progress Notes (Unsigned)
01/16/2023  Reason for Visit:  Left nipple bloody discharge.  Requesting Provider:  Ilda Basset, NP  History of Present Illness: Carolyn Rios is a 47 y.o. female presenting for evaluation of left nipple bloody discharge.  She reports that about 2 months ago she had a 1-week episode of daily left nipple blood drainage.  There was no associated trauma. The patient reports that this happened one morning while she was showering.  The discharge has been very minimal with only few drops intermittently.  Then the drainage stopped on its own after a week and has not recurred since.  She has a history of prior I&D of bilateral breasts.  She had a diagnostic mammogram on 12/05/22 which showed some dilated ducts within the retroareolar left breast which contained possible debris vs mass.  This was biopsied on 12/15/22 and pathology was benign mammary parenchyma with dilated duct space and chronic inflammation, but no mass or any atypical proliferative disease.  Given the drainage though, she was referred for further evaluation.  Radiology did recommend bilateral breast MRI to further evaluate the etiology for the left nipple discharge.  The patient denies any pain in the left breast.  Denies any other areas of drainage and the drainage has not recurred since that 1-week episode.  Denies any palpable masses or any skin changes.  Past Medical History: Past Medical History:  Diagnosis Date   Abscess of right breast 01/08/2019   Diabetes mellitus without complication (HCC)    Left breast abscess    No known health problems 04/04/2018     Past Surgical History: Past Surgical History:  Procedure Laterality Date   BREAST BIOPSY Left 12/15/2022   [place] Core, - venus clip - path pending   BREAST BIOPSY Left 12/15/2022   [place] LT BREAST BX W LOC DEV 1ST LESION IMG BX SPEC US GUIDE 12/15/2022 [facility]   BREAST CYST ASPIRATION Left 03/2018   abscess drainage   BREAST EXCISIONAL BIOPSY Left 06/2018    Dr Everlene Farrier surgical removed abscess   INCISION AND DRAINAGE ABSCESS Left 06/28/2018   Procedure: INCISION AND DRAINAGE LEFT BREAST ABSCESS;  Surgeon: Leafy Ro, MD;  Location: [facility];  Service: General;  Laterality: Left;   INCISION AND DRAINAGE ABSCESS Right 01/09/2019   Procedure: INCISION AND DRAINAGE ABSCESS Breast;  Surgeon: Ancil Linsey, MD;  Location: [facility];  Service: General;  Laterality: Right;   NO PAST SURGERIES      Home Medications: Prior to Admission medications   Medication Sig Start Date End Date Taking? Authorizing Provider  atorvastatin (LIPITOR) 20 MG tablet Take 20 mg by mouth daily.   Yes [provider]  cholecalciferol (VITAMIN D3) 25 MCG (1000 UNIT) tablet Take 1,000 Units by mouth daily.   Yes [provider]  glipiZIDE (GLUCOTROL XL) 5 MG 24 hr tablet Take 5 mg by mouth daily. 10/25/22  Yes [provider]  JANUVIA 100 MG tablet Take 100 mg by mouth daily. 10/25/22  Yes [provider]  JARDIANCE 25 MG TABS tablet Take 25 mg by mouth daily.   Yes [provider]  metFORMIN (GLUCOPHAGE-XR) 500 MG 24 hr tablet Take 500 mg by mouth 2 (two) times daily with a meal. 03/30/22  Yes [provider]    Allergies: No Known Allergies  Social History:  reports that she has never smoked. She has never been exposed to tobacco smoke. She has never used smokeless tobacco. She reports that she does not currently use alcohol  after a past usage of about 3.0 standard drinks of alcohol per week. She reports that she does not use drugs.   Family History: Family History  Problem Relation Age of Onset   Diabetes Father    Blindness Mother    Diabetes Mother    Diabetes Sister    Hypertension Sister    Cancer Neg Hx     Review of Systems: Review of Systems  Constitutional:  Negative for chills and fever.  Respiratory:  Negative for shortness of breath.   Cardiovascular:  Negative for chest pain.   Gastrointestinal:  Negative for abdominal pain, nausea and vomiting.  Genitourinary:  Negative for dysuria.  Musculoskeletal:  Negative for myalgias.  Skin:        Left nipple bloody discharge.  Neurological:  Negative for dizziness.  Psychiatric/Behavioral:  Negative for depression.     Physical Exam BP 109/73   Pulse 90   Temp 98 F (36.7 C)   Ht 5' (1.524 m)   Wt 133 lb (60.3 kg)   LMP 10/25/2022   SpO2 97%   BMI 25.97 kg/m  CONSTITUTIONAL: No acute distress, well nourished. HEENT:  Normocephalic, atraumatic, extraocular motion intact. NECK: Trachea is midline, and there is no jugular venous distension.  RESPIRATORY:  Lungs are clear, and breath sounds are equal bilaterally. Normal respiratory effort without pathologic use of accessory muscles. CARDIOVASCULAR: Heart is regular without murmurs, gallops, or rubs. BREAST:  Left breast without any palpable masses, skin changes.  There is minimal serous drainage with manual pressure around the nipple.  No bloody discharge.  Nipple is inverted.  No left axillary lymphadenopathy.  Right breast without any palpable masses, skin changes.  Nipple is inverted.  No drainage with manual pressure.  No right axillary lymphadenopathy. MUSCULOSKELETAL:  Normal muscle strength and tone in all four extremities.  No peripheral edema or cyanosis. SKIN: Skin turgor is normal. There are no pathologic skin lesions.  NEUROLOGIC:  Motor and sensation is grossly normal.  Cranial nerves are grossly intact. PSYCH:  Alert and oriented to person, place and time. Affect is normal.  Laboratory Analysis: Left breast biopsy on 12/15/22: DIAGNOSIS:  A. BREAST, LEFT SUBAREOLAR; ULTRASOUND-GUIDED CORE NEEDLE BIOPSY:  - BENIGN MAMMARY PARENCHYMA DEMONSTRATING DILATED DUCT SPACE, STROMAL FIBROSIS, AND CHRONIC INFLAMMATION, MOST SUGGESTIVE OF DUCT ECTASIA.  - NO EVIDENCE OF INTRADUCTAL PAPILLOMATOUS GROWTH OR ATYPICAL PROLIFERATIVE BREAST DISEASE IN THIS SAMPLE.    Imaging: Left breast mammogram and U/S on 12/05/22: FINDINGS: No concerning masses, calcifications or nonsurgical distortion identified within the left breast.   Targeted ultrasound is performed, showing multiple prominent and mildly dilated ducts within the retroareolar left breast. Many of these ducts have internal echogenic material within, potentially debris or solid masses. No left axillary adenopathy.   IMPRESSION: Indeterminate dilated ducts within the retroareolar left breast containing echogenic material which may represent intraductal masses or potentially blood products/proteinaceous debris.   RECOMMENDATION: Ultrasound-guided core needle biopsy of the retroareolar conglomerate of dilated ducts with intraductal debris versus masses.   If these are benign in etiology, the next step would be a bilateral breast MRI for further evaluation of the left nipple discharge and surgical consultation.   I have discussed the findings and recommendations with the patient. If applicable, a reminder letter will be sent to the patient regarding the next appointment.   BI-RADS CATEGORY  4: Suspicious.  Assessment and Plan: This is a 47 y.o. female with left breast bloody discharge.  --The patient had a 1-week episode  of intermittent left nipple discharge which resolved on its own.  Has not had any further drainage since.  Her mammogram and ultrasound showed dilated ducts in the retroareolar left breast with possible debris vs mass. However, biopsy did not reveal any masses and showed duct ectasia.  Radiology has recommended obtaining bilateral breast MRI to further evaluate.  Discussed with the patient the recommendations in order to investigate further why she had the drainage.  If MRI is not showing any suspicious findings, then this may have been an isolated episode and we could potentially follow up with repeat mammogram / [place] in the future.  If there is any finding on MRI, may end up  needing biopsy. --The patient is in agreement with this plan and all of her questions have been answered.  She will follow up with me after MRI to discuss any further plans.  I spent 40 minutes dedicated to the care of this patient on the date of this encounter to include pre-visit review of records, face-to-face time with the patient discussing diagnosis and management, and any post-visit coordination of care.   Howie Ill, MD [facility]

## 2023-01-16 NOTE — Patient Instructions (Addendum)
We will get you scheduled for a Breast MRI. Le programaremos una resonancia magntica de [place]. Le llamaremos sobre esta cita.  We will have you follow up here once we get the results of the MRI. Haremos un seguimiento aqu una vez que obtengamos los resultados de la resonancia magntica.

## 2023-01-18 ENCOUNTER — Other Ambulatory Visit: Payer: Self-pay | Admitting: Hematology and Oncology

## 2023-01-18 DIAGNOSIS — N6452 Nipple discharge: Secondary | ICD-10-CM

## 2023-01-25 ENCOUNTER — Ambulatory Visit: Payer: Self-pay

## 2023-02-10 ENCOUNTER — Telehealth: Payer: Self-pay

## 2023-02-10 NOTE — Telephone Encounter (Signed)
Message left for the patient that we were canceling her follow up appointment with Dr Aleen Campi. She did not have her Breast MRI completed. She will need to reschedule this.

## 2023-02-10 NOTE — Telephone Encounter (Signed)
-----   Message from Ilda Foil sent at 02/10/2023  8:15 AM EDT ----- Regarding: FW: MRI breast  ----- Message ----- From: Henrene Dodge, MD Sent: 02/09/2023   5:20 PM EDT To: [facility] Subject: MRI breast                                     Hi,  I saw this patient early June for left nipple discharge.  She had mammogram and u/s and had a biopsy which was negative, but radiology recommended bilateral breast MRI.  Looks she was scheduled for MRI on 6/12 but she either did not show or canceled within 24 hrs.  Can y'all try to get this rescheduled?  Likely will need to change the appointment date if it's not going to be done this weekend.  Thanks!  [employer]

## 2023-02-13 ENCOUNTER — Ambulatory Visit: Payer: Self-pay | Admitting: Surgery

## 2023-08-03 ENCOUNTER — Ambulatory Visit: Payer: Self-pay

## 2023-08-03 VITALS — BP 129/86 | Ht 60.0 in | Wt 136.0 lb

## 2023-08-03 DIAGNOSIS — Z3009 Encounter for other general counseling and advice on contraception: Secondary | ICD-10-CM

## 2023-08-03 DIAGNOSIS — Z3202 Encounter for pregnancy test, result negative: Secondary | ICD-10-CM

## 2023-08-03 LAB — PREGNANCY, URINE: Preg Test, Ur: NEGATIVE

## 2023-08-03 NOTE — Progress Notes (Signed)
UPT negative. Results discussed. Negative preg packet given and reviewed.  Patient states she had menses 07/14/2023 after not having a period for "3 years." States implant removed "2 yrs ago." Has [facility] and has diabetes. Advised to f-u with PCP and/or [facility] to evaluate symptoms and to determine if entering menopause. Patient states she will discuss with PCP.  Questions answered and reports understanding. Lang line [phone], interpreter today. Jerel Shepherd, RN

## 2023-12-22 ENCOUNTER — Ambulatory Visit: Payer: Self-pay

## 2023-12-22 ENCOUNTER — Ambulatory Visit (LOCAL_COMMUNITY_HEALTH_CENTER): Payer: Self-pay

## 2023-12-22 DIAGNOSIS — Z719 Counseling, unspecified: Secondary | ICD-10-CM

## 2023-12-22 DIAGNOSIS — Z23 Encounter for immunization: Secondary | ICD-10-CM

## 2023-12-22 NOTE — Progress Notes (Signed)
 Pt in nurse clinic with daughter for Immigration vaccines. Reviewed NCIR/Epic, explained VIS and given to pt. Pt meets ACIP criteria for free Twinrix and Tdap, pt declined [employer] PAP offered and paid Varicella and Polio vaccines instead. Administered vaccines, tolerated well. Updated NCIR and explained, copies given to pt, verbalized understanding. M.Koralyn Prestage, LPN.

## 2023-12-29 ENCOUNTER — Ambulatory Visit: Payer: Self-pay

## 2023-12-29 DIAGNOSIS — Z719 Counseling, unspecified: Secondary | ICD-10-CM

## 2023-12-29 NOTE — Progress Notes (Signed)
 Client seen in clinic today requesting MMR vaccine.  Interpretation provided by Marlene Yemen.  Client had dose of MMR from 2016 in EPIC given by [facility].  However this does was not on her NCIR record.  NCIR record updated.  No vaccines due today.  Client due for 2nd Polio, 2nd Varicella and 2nd Twinrix on or after 01/19/2024.  Appointment Schedule for 01/19/2024 at 3:30.   Copy of NCIR given.  Client with no questions.  Alyana Kreiter Sandrea Cruel, RN

## 2024-01-19 ENCOUNTER — Ambulatory Visit: Payer: Self-pay

## 2024-05-30 ENCOUNTER — Encounter: Payer: Self-pay | Admitting: Family Medicine

## 2024-06-03 ENCOUNTER — Telehealth: Payer: Self-pay
